# Patient Record
Sex: Female | Born: 1980 | Race: Black or African American | Hispanic: No | Smoking: Never smoker
Health system: Southern US, Community
[De-identification: ages and names within clinical notes are randomized; demographics above are authoritative.]

## PROBLEM LIST (undated history)

## (undated) ENCOUNTER — Inpatient Hospital Stay (HOSPITAL_COMMUNITY): Payer: Self-pay

## (undated) DIAGNOSIS — F32A Depression, unspecified: Secondary | ICD-10-CM

## (undated) DIAGNOSIS — A6000 Herpesviral infection of urogenital system, unspecified: Secondary | ICD-10-CM

## (undated) DIAGNOSIS — Z3687 Encounter for antenatal screening for uncertain dates: Secondary | ICD-10-CM

## (undated) DIAGNOSIS — B951 Streptococcus, group B, as the cause of diseases classified elsewhere: Secondary | ICD-10-CM

## (undated) DIAGNOSIS — Z6791 Unspecified blood type, Rh negative: Secondary | ICD-10-CM

## (undated) DIAGNOSIS — F329 Major depressive disorder, single episode, unspecified: Secondary | ICD-10-CM

## (undated) DIAGNOSIS — B999 Unspecified infectious disease: Secondary | ICD-10-CM

## (undated) DIAGNOSIS — O98819 Other maternal infectious and parasitic diseases complicating pregnancy, unspecified trimester: Secondary | ICD-10-CM

## (undated) DIAGNOSIS — Z6721 Type B blood, Rh negative: Secondary | ICD-10-CM

## (undated) DIAGNOSIS — J45909 Unspecified asthma, uncomplicated: Secondary | ICD-10-CM

## (undated) DIAGNOSIS — O99820 Streptococcus B carrier state complicating pregnancy: Secondary | ICD-10-CM

## (undated) DIAGNOSIS — B009 Herpesviral infection, unspecified: Secondary | ICD-10-CM

## (undated) DIAGNOSIS — A599 Trichomoniasis, unspecified: Secondary | ICD-10-CM

## (undated) DIAGNOSIS — O98211 Gonorrhea complicating pregnancy, first trimester: Secondary | ICD-10-CM

## (undated) HISTORY — DX: Type B blood, Rh negative: Z67.21

## (undated) HISTORY — DX: Other maternal infectious and parasitic diseases complicating pregnancy, unspecified trimester: O98.819

## (undated) HISTORY — DX: Gonorrhea complicating pregnancy, first trimester: O98.211

## (undated) HISTORY — DX: Unspecified infectious disease: B99.9

## (undated) HISTORY — DX: Herpesviral infection, unspecified: B00.9

## (undated) HISTORY — DX: Streptococcus, group b, as the cause of diseases classified elsewhere: B95.1

## (undated) HISTORY — DX: Encounter for antenatal screening for uncertain dates: Z36.87

## (undated) HISTORY — PX: THERAPEUTIC ABORTION: SHX798

## (undated) HISTORY — DX: Unspecified blood type, rh negative: Z67.91

---
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
## 2001-09-12 ENCOUNTER — Emergency Department (HOSPITAL_COMMUNITY): Admission: EM | Admit: 2001-09-12 | Discharge: 2001-09-12 | Payer: Self-pay | Admitting: Emergency Medicine

## 2003-11-05 ENCOUNTER — Inpatient Hospital Stay (HOSPITAL_COMMUNITY): Admission: AD | Admit: 2003-11-05 | Discharge: 2003-11-05 | Payer: Self-pay | Admitting: *Deleted

## 2003-11-19 ENCOUNTER — Ambulatory Visit (HOSPITAL_COMMUNITY): Admission: AD | Admit: 2003-11-19 | Discharge: 2003-11-19 | Payer: Self-pay | Admitting: *Deleted

## 2004-03-01 DIAGNOSIS — B951 Streptococcus, group B, as the cause of diseases classified elsewhere: Secondary | ICD-10-CM

## 2004-03-01 HISTORY — DX: Streptococcus, group b, as the cause of diseases classified elsewhere: B95.1

## 2004-04-23 ENCOUNTER — Inpatient Hospital Stay (HOSPITAL_COMMUNITY): Admission: AD | Admit: 2004-04-23 | Discharge: 2004-04-23 | Payer: Self-pay | Admitting: Obstetrics and Gynecology

## 2004-06-21 ENCOUNTER — Inpatient Hospital Stay (HOSPITAL_COMMUNITY): Admission: AD | Admit: 2004-06-21 | Discharge: 2004-06-21 | Payer: Self-pay | Admitting: Obstetrics and Gynecology

## 2004-06-22 ENCOUNTER — Inpatient Hospital Stay (HOSPITAL_COMMUNITY): Admission: AD | Admit: 2004-06-22 | Discharge: 2004-06-24 | Payer: Self-pay | Admitting: Obstetrics and Gynecology

## 2004-07-20 ENCOUNTER — Other Ambulatory Visit: Admission: RE | Admit: 2004-07-20 | Discharge: 2004-07-20 | Payer: Self-pay | Admitting: Obstetrics and Gynecology

## 2005-10-25 ENCOUNTER — Other Ambulatory Visit: Admission: RE | Admit: 2005-10-25 | Discharge: 2005-10-25 | Payer: Self-pay | Admitting: Obstetrics and Gynecology

## 2006-05-30 ENCOUNTER — Inpatient Hospital Stay (HOSPITAL_COMMUNITY): Admission: AD | Admit: 2006-05-30 | Discharge: 2006-06-01 | Payer: Self-pay | Admitting: Obstetrics and Gynecology

## 2011-01-01 ENCOUNTER — Emergency Department (HOSPITAL_COMMUNITY)
Admission: EM | Admit: 2011-01-01 | Discharge: 2011-01-02 | Disposition: A | Discharge status: Home / Self Care | Payer: BC Managed Care – PPO | Attending: Emergency Medicine | Admitting: Emergency Medicine

## 2011-01-01 ENCOUNTER — Emergency Department (HOSPITAL_COMMUNITY): Payer: BC Managed Care – PPO

## 2011-01-01 DIAGNOSIS — S90129A Contusion of unspecified lesser toe(s) without damage to nail, initial encounter: Secondary | ICD-10-CM | POA: Insufficient documentation

## 2011-01-01 DIAGNOSIS — M79609 Pain in unspecified limb: Secondary | ICD-10-CM | POA: Insufficient documentation

## 2011-01-01 DIAGNOSIS — Y92009 Unspecified place in unspecified non-institutional (private) residence as the place of occurrence of the external cause: Secondary | ICD-10-CM | POA: Insufficient documentation

## 2011-01-01 DIAGNOSIS — W208XXA Other cause of strike by thrown, projected or falling object, initial encounter: Secondary | ICD-10-CM | POA: Insufficient documentation

## 2011-04-30 HISTORY — PX: WISDOM TOOTH EXTRACTION: SHX21

## 2011-06-07 ENCOUNTER — Ambulatory Visit: Payer: Self-pay | Admitting: Obstetrics and Gynecology

## 2011-06-30 ENCOUNTER — Encounter: Payer: Self-pay | Admitting: Obstetrics and Gynecology

## 2011-06-30 ENCOUNTER — Ambulatory Visit (INDEPENDENT_AMBULATORY_CARE_PROVIDER_SITE_OTHER): Payer: BC Managed Care – PPO | Admitting: Obstetrics and Gynecology

## 2011-06-30 VITALS — BP 108/74 | HR 66 | Ht 62.0 in | Wt 157.0 lb

## 2011-06-30 DIAGNOSIS — N898 Other specified noninflammatory disorders of vagina: Secondary | ICD-10-CM

## 2011-06-30 DIAGNOSIS — Z01419 Encounter for gynecological examination (general) (routine) without abnormal findings: Secondary | ICD-10-CM

## 2011-06-30 LAB — POCT WET PREP (WET MOUNT)
Clue Cells Wet Prep Whiff POC: NEGATIVE
pH: 4.5

## 2011-06-30 MED ORDER — FLUCONAZOLE 150 MG PO TABS: 150.0000 mg | ORAL_TABLET | Freq: Once | ORAL | Status: AC

## 2011-06-30 NOTE — Progress Notes (Signed)
Last Pap: 03/2010 WNL: Yes Regular Periods:no  Monthly Breast exam:no Tetanus<74yrs:yes Nl.Bladder Function:yes Daily BMs:yes Healthy Diet:yes Calcium:no Mammogram:no Exercise:yes Seatbelt: yes Abuse at home: no Stressful work:no Sigmoid-colonoscopy: n/a  Bone Density: No Tami [name], Charetha  C/o ?yeast sxs, reports vag discharge, no obvious itching  Filed Vitals:   06/30/11 1506  BP: 108/74  Pulse: 66   ROS: noncontributory  Physical Examination: General appearance - alert, well appearing, and in no distress Neck - supple, no significant adenopathy Chest - clear to auscultation, no wheezes, rales or rhonchi, symmetric air entry Heart - normal rate and regular rhythm Abdomen - soft, nontender, nondistended, no masses or organomegaly Breasts - breasts appear normal, no suspicious masses, no skin or nipple changes or axillary nodes Pelvic - normal external genitalia, vulva, vagina, cervix, uterus and adnexa Back exam - no CVAT Extremities - no edema, redness or tenderness in the calves or thighs  Results for orders placed in visit on 06/30/11  POCT WET PREP (WET MOUNT)      Component Value Range   Source Wet Prep POC vaginal     WBC, Wet Prep HPF POC       Bacteria Wet Prep HPF POC none     BACTERIA WET PREP MORPHOLOGY POC       Clue Cells Wet Prep HPF POC None     CLUE CELLS WET PREP WHIFF POC Negative Whiff     Yeast Wet Prep HPF POC None     KOH Wet Prep POC       Trichomonas Wet Prep HPF POC none     pH 4.5     A/P Nl AEX Pap next yr Diflucan to pharmacy if sxs recur

## 2011-12-01 ENCOUNTER — Ambulatory Visit (INDEPENDENT_AMBULATORY_CARE_PROVIDER_SITE_OTHER): Payer: BC Managed Care – PPO | Admitting: Obstetrics and Gynecology

## 2011-12-01 DIAGNOSIS — Z331 Pregnant state, incidental: Secondary | ICD-10-CM

## 2011-12-01 DIAGNOSIS — B009 Herpesviral infection, unspecified: Secondary | ICD-10-CM

## 2011-12-01 HISTORY — DX: Herpesviral infection, unspecified: B00.9

## 2011-12-02 ENCOUNTER — Other Ambulatory Visit: Payer: Self-pay | Admitting: Obstetrics and Gynecology

## 2011-12-02 DIAGNOSIS — O26849 Uterine size-date discrepancy, unspecified trimester: Secondary | ICD-10-CM

## 2011-12-02 LAB — POCT URINALYSIS DIPSTICK
Blood, UA: NEGATIVE
Glucose, UA: NEGATIVE
Spec Grav, UA: 1.02
Urobilinogen, UA: NEGATIVE

## 2011-12-02 LAB — PRENATAL PANEL VII
Basophils Absolute: 0 10*3/uL (ref 0.0–0.1)
Basophils Relative: 0 % (ref 0–1)
Eosinophils Absolute: 0.3 10*3/uL (ref 0.0–0.7)
HIV: NONREACTIVE
Hepatitis B Surface Ag: NEGATIVE
MCH: 30.5 pg (ref 26.0–34.0)
MCHC: 34.5 g/dL (ref 30.0–36.0)
Neutro Abs: 7.3 10*3/uL (ref 1.7–7.7)
Neutrophils Relative %: 67 % (ref 43–77)
RDW: 13.7 % (ref 11.5–15.5)
Rh Type: NEGATIVE

## 2011-12-02 NOTE — Progress Notes (Signed)
NOB interview completed.  Pt has unknown lmp, scheduled pt for dating [place] on 12/07/11 @ 1330, NOB work up then scheduled @ 1400 w/ [facility].

## 2011-12-03 LAB — HEMOGLOBINOPATHY EVALUATION
Hemoglobin Other: 0 %
Hgb A2 Quant: 3.1 % (ref 2.2–3.2)
Hgb A: 96.6 % — ABNORMAL LOW (ref 96.8–97.8)
Hgb F Quant: 0.3 % (ref 0.0–2.0)

## 2011-12-04 LAB — OB RESULTS CONSOLE GBS: GBS: NEGATIVE

## 2011-12-04 LAB — CULTURE, OB URINE: Colony Count: 3000

## 2011-12-07 ENCOUNTER — Ambulatory Visit (INDEPENDENT_AMBULATORY_CARE_PROVIDER_SITE_OTHER): Payer: BC Managed Care – PPO

## 2011-12-07 VITALS — BP 100/62 | Wt 167.0 lb

## 2011-12-07 DIAGNOSIS — Z3687 Encounter for antenatal screening for uncertain dates: Secondary | ICD-10-CM

## 2011-12-07 DIAGNOSIS — B009 Herpesviral infection, unspecified: Secondary | ICD-10-CM

## 2011-12-07 DIAGNOSIS — Z789 Other specified health status: Secondary | ICD-10-CM

## 2011-12-07 DIAGNOSIS — Z331 Pregnant state, incidental: Secondary | ICD-10-CM

## 2011-12-07 DIAGNOSIS — O26849 Uterine size-date discrepancy, unspecified trimester: Secondary | ICD-10-CM

## 2011-12-07 DIAGNOSIS — Z3490 Encounter for supervision of normal pregnancy, unspecified, unspecified trimester: Secondary | ICD-10-CM

## 2011-12-07 DIAGNOSIS — Z6721 Type B blood, Rh negative: Secondary | ICD-10-CM

## 2011-12-07 HISTORY — DX: Encounter for antenatal screening for uncertain dates: Z36.87

## 2011-12-07 HISTORY — DX: Type B blood, Rh negative: Z67.21

## 2011-12-07 LAB — US OB COMP LESS 14 WKS

## 2011-12-07 NOTE — Progress Notes (Signed)
Pt is here for NOB work-up. Pt stated no issues today.

## 2011-12-07 NOTE — Progress Notes (Signed)
  Subjective:    Tami [name] is being seen today for her first obstetrical visit.  She is 7weeks based on an u/s today ([redacted]w[redacted]d based on LMP). 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 Relationship with [name]: spouse, living together. Patient does intend to breast feed. Pregnancy history fully reviewed.  Patient reports no complaints.  Review of Systems:   Review of Systems--negative for all systems  Objective:     BP 100/62  Wt 167 lb (75.751 kg)  BMI 30.54 kg/m2  LMP 06/13/2011 Physical Exam  Exam Physical Examination:  General appearance - alert, well appearing, and in no distress, oriented to person, place, and time and overweight Mental status - normal mood, behavior, speech, dress, motor activity, and thought processes Eyes - pupils equal and reactive, extraocular eye movements intact Neck - supple, no significant adenopathy, thyroid exam: thyroid is normal in size without nodules or tenderness Lymphatics - no palpable lymphadenopathy, no hepatosplenomegaly Chest - clear to auscultation, no wheezes, rales or rhonchi, symmetric air entry Heart - normal rate and regular rhythm Abdomen - soft, nontender, nondistended, no masses or organomegaly Breasts - breasts appear normal, no suspicious masses, no skin or nipple changes or axillary nodes Pelvic - normal external genitalia, vulva, vagina, cervix, uterus and adnexa, uterus enlarged 6-8 week size Extremities - peripheral pulses normal, no pedal edema, no clubbing or cyanosis Skin - normal coloration and turgor, no rashes, no suspicious skin lesions noted  U/s: SIUP w/ S<D w/ AUA=7weeks giving best EDC of 07/25/12; FHR=140 bpm; bilateral ov & adnexa WNL.   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 Prenatal vitamins. Problem list reviewed and updated. AFP3 discussed:  declined. Role of ultrasound in pregnancy discussed; fetal survey: ordered--anatomy at 19-20 weeks. Amniocentesis discussed: not indicated. Follow up in 4 weeks, or prn Practice routines rev'd.     Rexene Edison, CNM 12/07/11

## 2011-12-09 ENCOUNTER — Other Ambulatory Visit (INDEPENDENT_AMBULATORY_CARE_PROVIDER_SITE_OTHER): Payer: BC Managed Care – PPO

## 2011-12-09 DIAGNOSIS — O98211 Gonorrhea complicating pregnancy, first trimester: Secondary | ICD-10-CM

## 2011-12-09 DIAGNOSIS — IMO0002 Reserved for concepts with insufficient information to code with codable children: Secondary | ICD-10-CM

## 2011-12-09 DIAGNOSIS — O98219 Gonorrhea complicating pregnancy, unspecified trimester: Secondary | ICD-10-CM | POA: Insufficient documentation

## 2011-12-09 HISTORY — DX: Gonorrhea complicating pregnancy, first trimester: O98.211

## 2011-12-09 LAB — PAP IG, CT-NG, RFX HPV ASCU
Chlamydia Probe Amp: NEGATIVE
GC Probe Amp: POSITIVE — AB

## 2011-12-09 MED ORDER — CEFTRIAXONE SODIUM 1 G IJ SOLR
250.0000 mg | Freq: Once | INTRAMUSCULAR | Status: AC
  Administered 2011-12-09: 250 mg via INTRAMUSCULAR

## 2011-12-10 ENCOUNTER — Other Ambulatory Visit: Payer: BC Managed Care – PPO

## 2011-12-15 ENCOUNTER — Telehealth: Payer: Self-pay | Admitting: Obstetrics and Gynecology

## 2011-12-15 NOTE — Telephone Encounter (Signed)
Spoke with pt rgd msg pt states had positive gc states husband went and had testing he was neg wants to know why hers was positive his negative offered pt an appt to discuss with provider pt declined will discuss at appt 11/7

## 2011-12-21 ENCOUNTER — Telehealth: Payer: Self-pay | Admitting: Obstetrics and Gynecology

## 2011-12-22 ENCOUNTER — Encounter: Payer: BC Managed Care – PPO | Admitting: Obstetrics and Gynecology

## 2011-12-27 ENCOUNTER — Encounter: Payer: Self-pay | Admitting: Obstetrics and Gynecology

## 2011-12-27 ENCOUNTER — Ambulatory Visit (INDEPENDENT_AMBULATORY_CARE_PROVIDER_SITE_OTHER): Payer: BC Managed Care – PPO | Admitting: Obstetrics and Gynecology

## 2011-12-27 VITALS — BP 128/72 | Wt 167.0 lb

## 2011-12-27 DIAGNOSIS — N898 Other specified noninflammatory disorders of vagina: Secondary | ICD-10-CM

## 2011-12-27 DIAGNOSIS — N760 Acute vaginitis: Secondary | ICD-10-CM

## 2011-12-27 DIAGNOSIS — Z8619 Personal history of other infectious and parasitic diseases: Secondary | ICD-10-CM

## 2011-12-27 LAB — POCT WET PREP (WET MOUNT)

## 2011-12-27 MED ORDER — TERCONAZOLE 0.4 % VA CREA: 1.0000 | TOPICAL_CREAM | Freq: Every day | VAGINAL | Status: DC

## 2011-12-27 NOTE — Progress Notes (Signed)
[redacted]w[redacted]d Pt also c/o yeast infection. Has a white discharge with vaginal itching.  Wet prep shows yeast.  Terazol 7 ordered +GC 12/07/2011 pt wants to discuss. The patient has been married for 4 years with her husband as her only sexual partner.  He had negative gonorrhea testing.  She still certain that she could not had gonorrhea for 4 years and not had them be infected and she had no symptoms.she is concerned about a POSITIVE test but did not want to take the chance of not being treated.  She states her husband has made an appointment with a divorce attorney. She wants to have her test of cure culture done today.  She understands at her treatment was slightly less than 3 weeks ago which is our usual interval for test of cure.

## 2012-01-06 ENCOUNTER — Ambulatory Visit (INDEPENDENT_AMBULATORY_CARE_PROVIDER_SITE_OTHER): Payer: BC Managed Care – PPO | Admitting: Obstetrics and Gynecology

## 2012-01-06 VITALS — BP 122/80 | Wt 168.0 lb

## 2012-01-06 DIAGNOSIS — Z139 Encounter for screening, unspecified: Secondary | ICD-10-CM

## 2012-01-06 NOTE — Progress Notes (Signed)
Very tearful today.  Relationship is very strained due to +GC result.  Patient reports TOC done at last visit on 10/28 by [facility]--understands it was early. I am unable to find that result--I see where it was ordered. Lerry Liner, CMA, will investigate. TOC today. Patient declines genetic testing.   Support offered for patient's issues and concerns.

## 2012-01-06 NOTE — Progress Notes (Signed)
[redacted]w[redacted]d Pt to have TOC today Pt very emotional

## 2012-01-07 LAB — GC/CHLAMYDIA PROBE AMP
CT Probe RNA: NEGATIVE
GC Probe RNA: NEGATIVE

## 2012-01-31 ENCOUNTER — Encounter: Payer: Self-pay | Admitting: Obstetrics and Gynecology

## 2012-01-31 ENCOUNTER — Ambulatory Visit (INDEPENDENT_AMBULATORY_CARE_PROVIDER_SITE_OTHER): Payer: BC Managed Care – PPO | Admitting: Obstetrics and Gynecology

## 2012-01-31 VITALS — BP 100/70 | Wt 175.0 lb

## 2012-01-31 DIAGNOSIS — Z3689 Encounter for other specified antenatal screening: Secondary | ICD-10-CM

## 2012-01-31 DIAGNOSIS — Z3687 Encounter for antenatal screening for uncertain dates: Secondary | ICD-10-CM

## 2012-01-31 DIAGNOSIS — Z1389 Encounter for screening for other disorder: Secondary | ICD-10-CM

## 2012-01-31 DIAGNOSIS — Z3480 Encounter for supervision of other normal pregnancy, unspecified trimester: Secondary | ICD-10-CM | POA: Insufficient documentation

## 2012-01-31 NOTE — Progress Notes (Signed)
[redacted]w[redacted]d 01/06/12 TOC GC/CT neg Pt will get flu vaccine at husband's job No concerns per pt  Reviewed all NOB labs. Declines genetic testing Anatomy u/s [facility]

## 2012-02-16 ENCOUNTER — Telehealth: Payer: Self-pay | Admitting: Obstetrics and Gynecology

## 2012-02-16 NOTE — Telephone Encounter (Signed)
Tc to pt regarding msg.  Pt states has been under a lot of stress @ home and notices when she gets stressed she has lower abdominal pain.  Pt does not describe herself as being doubled over in pain, denies any bldg or abnl d/c.  She states when she sits down to relax it gets better.  Pt denies any hx of depression or anxiety.  Pt also states she drinks about 3-4 8 oz glass of water/day.  Pt advised pain may be ligament pain, advised to push fluids more may take Tylenol or Ibuprofen 600mg  Q 6hours for pain, continue to monitor sxs.  When she feels herself getting stressed recommended she get in a room by her self, take breaths and relax and when she comes back into the office on 02/28/12 for next appt to discuss w/ [facility], who she's seeing.  Pt voices agreement, will call office with any concerns prior to appt.

## 2012-02-28 ENCOUNTER — Ambulatory Visit (INDEPENDENT_AMBULATORY_CARE_PROVIDER_SITE_OTHER): Payer: BC Managed Care – PPO | Admitting: Obstetrics and Gynecology

## 2012-02-28 ENCOUNTER — Ambulatory Visit (INDEPENDENT_AMBULATORY_CARE_PROVIDER_SITE_OTHER): Payer: BC Managed Care – PPO

## 2012-02-28 VITALS — BP 120/70 | Wt 177.0 lb

## 2012-02-28 DIAGNOSIS — Z3480 Encounter for supervision of other normal pregnancy, unspecified trimester: Secondary | ICD-10-CM

## 2012-02-28 DIAGNOSIS — O358XX0 Maternal care for other (suspected) fetal abnormality and damage, not applicable or unspecified: Secondary | ICD-10-CM

## 2012-02-28 DIAGNOSIS — O98211 Gonorrhea complicating pregnancy, first trimester: Secondary | ICD-10-CM

## 2012-02-28 DIAGNOSIS — Z3689 Encounter for other specified antenatal screening: Secondary | ICD-10-CM

## 2012-02-28 DIAGNOSIS — O98219 Gonorrhea complicating pregnancy, unspecified trimester: Secondary | ICD-10-CM

## 2012-02-28 NOTE — Progress Notes (Signed)
[redacted]w[redacted]d No complaints today. Ultrasound shows:  SIUP  S=D     [place] EDD: 07/22/2012            EFW: 10 oz           AFI: n/a           Cervical length: 3.19 cm           Placenta localization: posterior           Fetal presentation: vertex                   Anatomy survey is normal           Gender : female Comments: no previa, placenta edge to cx = 3.7 cm - normal Normal placental cord insertion Normal fluid: AP pocket = 5 cm Measurements concordant with 1st u/s and established GA/EDD No abnormality seen Suggest f/u in 2-3 weeks or next visit cx closed. Normal ovaries/adnexa  C/o stress from her husband.  Recommended counseling for stress reduction.  Pt declined offer to set up counseling.  Felt she could do it herself. Requests STD testing again at [facility].  TOC from 01/10/12 was neg. U/S @ [facility] to complete anatomy

## 2012-02-29 LAB — US OB COMP + 14 WK

## 2012-03-01 NOTE — L&D Delivery Note (Signed)
Delivery Note Pt's labored progressed rapidly after admission.  Pt had requested an epidural a little after 0300, but by the time CBC resulted, cx 9cm.  At 0347, AROM, copious amt of clear fluid.  At 3:49 AM a viable female "Tami [name]" was delivered via Vaginal, Spontaneous Delivery (Presentation: ; Occiput Anterior).  APGAR: 9, 9; weight TBA .   Placenta status: Intact, Spontaneous, Schultz.  Cord: 3 vessels with the following complications: .  Cord pH: not collected.  Anesthesia: None  Episiotomy: None Lacerations: None Suture Repair: n/a Est. Blood Loss (mL): 250  Mom to postpartum.  Baby to nursery-stable. Pt desires inpatient circ, and plans to BF.    Jewels Langone H 07/08/2012, 4:13 AM

## 2012-03-20 ENCOUNTER — Telehealth: Payer: Self-pay | Admitting: Obstetrics and Gynecology

## 2012-03-20 ENCOUNTER — Other Ambulatory Visit: Payer: Self-pay | Admitting: Obstetrics and Gynecology

## 2012-03-20 ENCOUNTER — Inpatient Hospital Stay (HOSPITAL_COMMUNITY)
Admission: AD | Admit: 2012-03-20 | Discharge: 2012-03-20 | Disposition: A | Discharge status: Home / Self Care | Payer: BC Managed Care – PPO | Source: Ambulatory Visit | Attending: Obstetrics and Gynecology | Admitting: Obstetrics and Gynecology

## 2012-03-20 DIAGNOSIS — Z3687 Encounter for antenatal screening for uncertain dates: Secondary | ICD-10-CM

## 2012-03-20 DIAGNOSIS — O209 Hemorrhage in early pregnancy, unspecified: Secondary | ICD-10-CM

## 2012-03-20 DIAGNOSIS — O99891 Other specified diseases and conditions complicating pregnancy: Secondary | ICD-10-CM | POA: Insufficient documentation

## 2012-03-20 DIAGNOSIS — R319 Hematuria, unspecified: Secondary | ICD-10-CM | POA: Insufficient documentation

## 2012-03-20 LAB — URINALYSIS, ROUTINE W REFLEX MICROSCOPIC
Leukocytes, UA: NEGATIVE
Nitrite: NEGATIVE
Specific Gravity, Urine: 1.025 (ref 1.005–1.030)
pH: 6 (ref 5.0–8.0)

## 2012-03-20 LAB — WET PREP, GENITAL
Clue Cells Wet Prep HPF POC: NONE SEEN
Trich, Wet Prep: NONE SEEN
Yeast Wet Prep HPF POC: NONE SEEN

## 2012-03-20 LAB — URINE MICROSCOPIC-ADD ON

## 2012-03-20 MED ORDER — NITROFURANTOIN MONOHYD MACRO 100 MG PO CAPS: 100.0000 mg | ORAL_CAPSULE | Freq: Two times a day (BID) | ORAL | Status: DC

## 2012-03-20 NOTE — Telephone Encounter (Signed)
Pt called, is 21w 6d, states this am about 0900 when she went to the BR, noticed some blood in the toilet.  Did not have any more bldg until this afternoon when she went to the BR again, had some burning with urination and had some pink spotting with wiping.  Pt denies any pain and reports some +fm today.  Pt states her blood type is B negative and denies any recent IC.  Per VL, pt to MAU for Rhogam work up and [place].  Spoke w/ Darel Hong, RN @ MAU regarding pt, SR notified via text msg.

## 2012-03-20 NOTE — MAU Note (Signed)
This morning when used toilet, saw blood in the toilet. At 2 p.m.  Had some burning with urination and saw pink on the tissue when wiped.

## 2012-03-20 NOTE — Progress Notes (Signed)
MD informed of lab results, see DC order.

## 2012-03-20 NOTE — MAU Provider Note (Addendum)
Subjective:    Tami [name] is a 32 y.o. female, G3P2002 at 21+6 weeks who presents with c/o bleeding. This am following an episode of vomiting, went to void and saw bright red blood in the toilet. Did not have to wear a pad all day. Went to void again at 2:00 pm and saw a pink tinge on toilet paper. No other episode. Reports 1 episode of burning with urination today but no frequency. No BM today. Denies abdominal or back pain. Reports FM as usual  The following portions of the patient's history were reviewed and updated as appropriate: allergies, current medications, past family history.  Review of Systems Pertinent items are noted in HPI. Gastrointestinal: Negative for abdominal pain, change in bowel habits or rectal bleeding   Objective:    BP 121/67  Pulse 77  Temp 98.1 F (36.7 C) (Oral)  Resp 18  Ht 5' 2.25" (1.581 m)  Wt 181 lb (82.101 kg)  BMI 32.84 kg/m2  LMP 06/13/2011    Weight:  Wt Readings from Last 1 Encounters:  03/20/12 181 lb (82.101 kg)          BMI: Body mass index is 32.84 kg/(m^2).  General Appearance: Alert, appropriate appearance for age. No acute distress HEENT: Grossly normal Neck / Thyroid: Supple, no masses, nodes or enlargement Lungs: clear to auscultation bilaterally Back: No CVA tenderness Cardiovascular: Regular rate and rhythm. S1, S2, no murmur Gastrointestinal: Soft, non-tender, no masses or organomegaly Uterus is appropriate for GA and NT Pelvic Exam: Vulva and vagina appear normal.  No blood in the vagina or cervix. Cervix is long and closed.     Assessment:    21+6 weeks with what appears like hematuria    Plan:    Awaiting U/A and wet prep GC / Chlamydia cultures obtained  Silverio Lay MD  U/A: large Hgb and many bacteria c/w UTI Wet prep essentially negative D/C home with Macrobid 100 mg BID for 7 days. Urine sent to culture. Follow-up as scheduled in office

## 2012-03-22 ENCOUNTER — Other Ambulatory Visit: Payer: BC Managed Care – PPO

## 2012-03-22 LAB — URINE CULTURE
Colony Count: NO GROWTH
Culture: NO GROWTH
Special Requests: NORMAL

## 2012-03-27 ENCOUNTER — Encounter: Payer: BC Managed Care – PPO | Admitting: Obstetrics and Gynecology

## 2012-03-27 ENCOUNTER — Other Ambulatory Visit: Payer: BC Managed Care – PPO

## 2012-03-27 ENCOUNTER — Ambulatory Visit: Payer: BC Managed Care – PPO

## 2012-03-27 ENCOUNTER — Encounter: Payer: Self-pay | Admitting: Obstetrics and Gynecology

## 2012-03-27 ENCOUNTER — Ambulatory Visit: Payer: BC Managed Care – PPO | Admitting: Obstetrics and Gynecology

## 2012-03-27 VITALS — BP 100/60 | Wt 181.0 lb

## 2012-03-27 DIAGNOSIS — O358XX0 Maternal care for other (suspected) fetal abnormality and damage, not applicable or unspecified: Secondary | ICD-10-CM

## 2012-03-27 DIAGNOSIS — Z889 Allergy status to unspecified drugs, medicaments and biological substances status: Secondary | ICD-10-CM

## 2012-03-27 DIAGNOSIS — Z331 Pregnant state, incidental: Secondary | ICD-10-CM

## 2012-03-27 DIAGNOSIS — Z3689 Encounter for other specified antenatal screening: Secondary | ICD-10-CM

## 2012-03-27 LAB — US OB FOLLOW UP

## 2012-03-27 NOTE — Progress Notes (Signed)
[redacted]w[redacted]d wgt gain 16# Pt requests repeat GC/CT, rv'd they were neg 1/20 from visit at hospital Pt tearful and states her husband has been unfaithful and she has now moved out, has support from other family members Offered counseling, pt declined stating she could not afford it at this time, will check on resources.  [place] rv'd, previous anatomy viewed now and WNL  RTO 4wks, needs 1hr gtt [place]

## 2012-03-27 NOTE — Progress Notes (Signed)
[redacted]w[redacted]d No complaints per pt  Ultrasound shows:  SIUP  S=D     [place] EDD: 5/27/143           AFI: normal fluid / AP pocket = 5.9 cm profile and nasal bone            EFW = 58th%tile           Cervical length: 3.25 cm           Placenta localization: posterior           Fetal presentation: vertex                   Anatomy survey is normal           Gender : female All cardiac seen today. Normal appearing. Profile and nasal bone seen today

## 2012-04-19 ENCOUNTER — Telehealth: Payer: Self-pay | Admitting: Obstetrics and Gynecology

## 2012-04-19 NOTE — Telephone Encounter (Signed)
Spoke with pt rgd msg pt states she has the answer to her questions already

## 2012-04-24 ENCOUNTER — Other Ambulatory Visit: Payer: BC Managed Care – PPO

## 2012-04-24 ENCOUNTER — Ambulatory Visit: Payer: BC Managed Care – PPO | Admitting: Obstetrics and Gynecology

## 2012-04-24 VITALS — BP 98/60 | Wt 187.0 lb

## 2012-04-24 DIAGNOSIS — Z331 Pregnant state, incidental: Secondary | ICD-10-CM

## 2012-04-24 LAB — CBC
HCT: 35.3 % — ABNORMAL LOW (ref 36.0–46.0)
Hemoglobin: 12.1 g/dL (ref 12.0–15.0)
MCV: 86.5 fL (ref 78.0–100.0)
RBC: 4.08 MIL/uL (ref 3.87–5.11)
RDW: 13.8 % (ref 11.5–15.5)
WBC: 11.1 10*3/uL — ABNORMAL HIGH (ref 4.0–10.5)

## 2012-04-24 NOTE — Progress Notes (Signed)
Pt stated no issues today. GTT today given @2 :43      Draw @ 3:43

## 2012-04-24 NOTE — Progress Notes (Signed)
101w6d Glucola, hemoglobin, RPR sent. Return to office in 3 weeks. Circumcision, car seats, preterm labor, breast-feeding, diet, exercise, and contraception discussed.  Patient wants Mirena IUD. Dr. Stefano Gaul

## 2012-04-25 LAB — RPR

## 2012-05-06 IMAGING — CR DG TOE GREAT 2+V*R*
3 series · 3 of 3 positions shown · non-contrast
Comparison: None.

CLINICAL DATA: Right great toe pain following an injury today.

RIGHT TOE - 2+ VIEW

[t toes ap right]
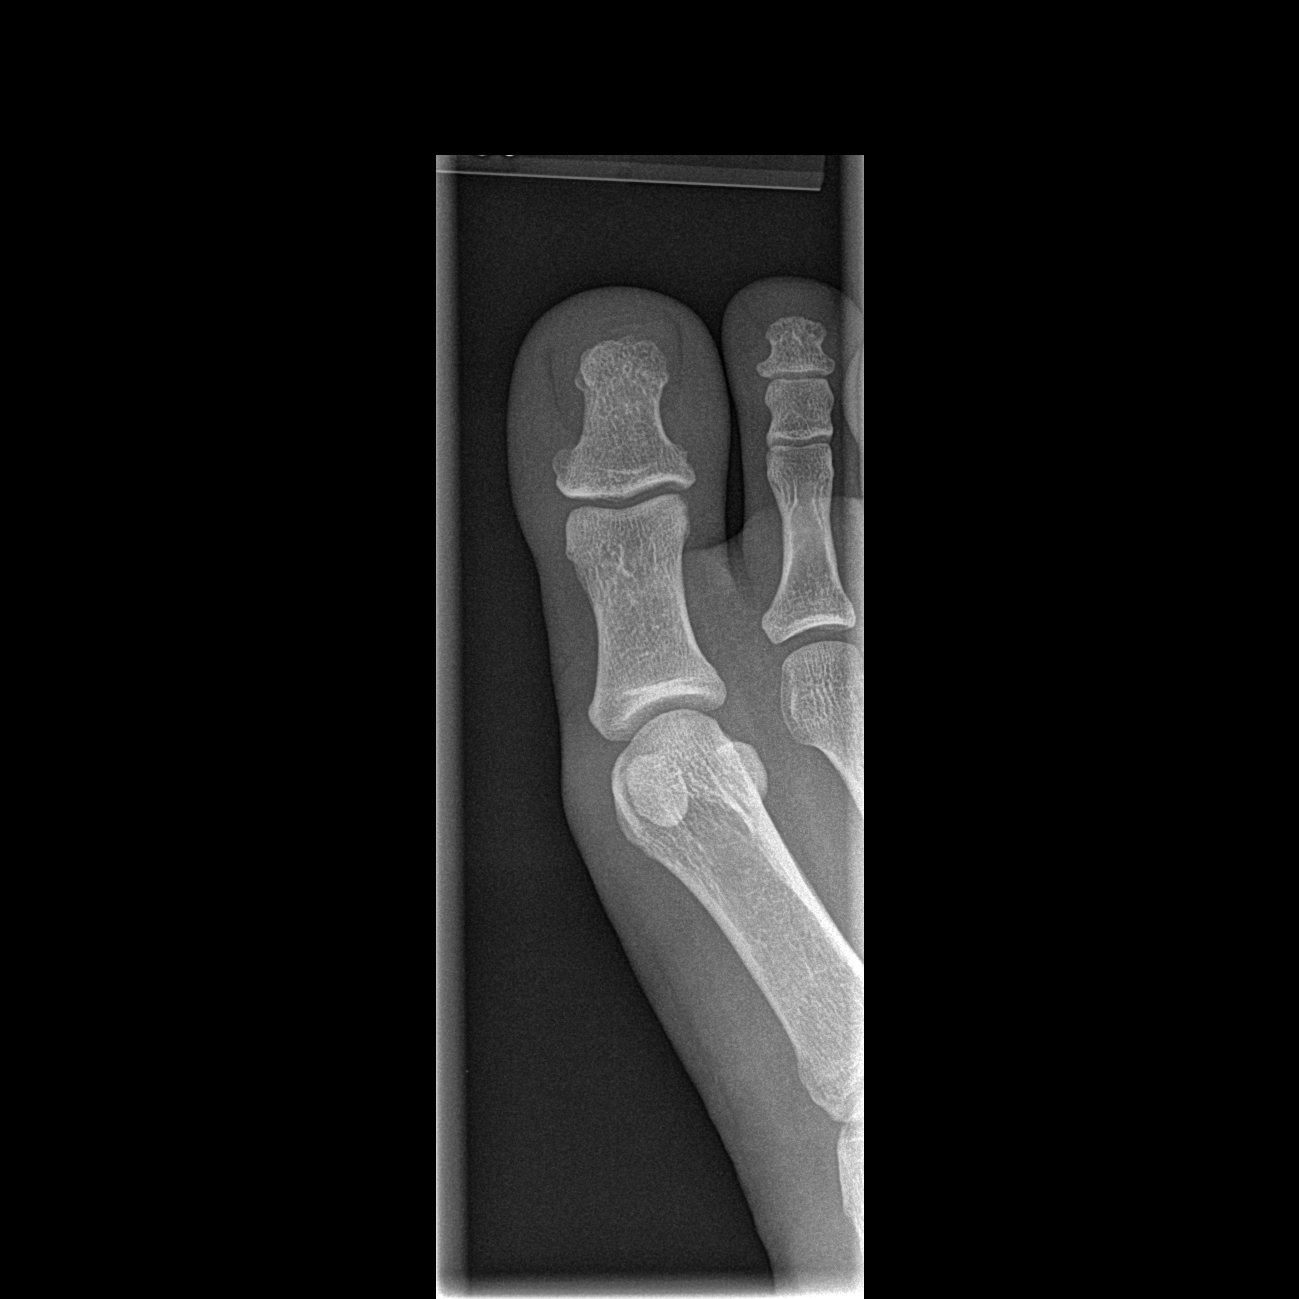

[t toes oblique right]
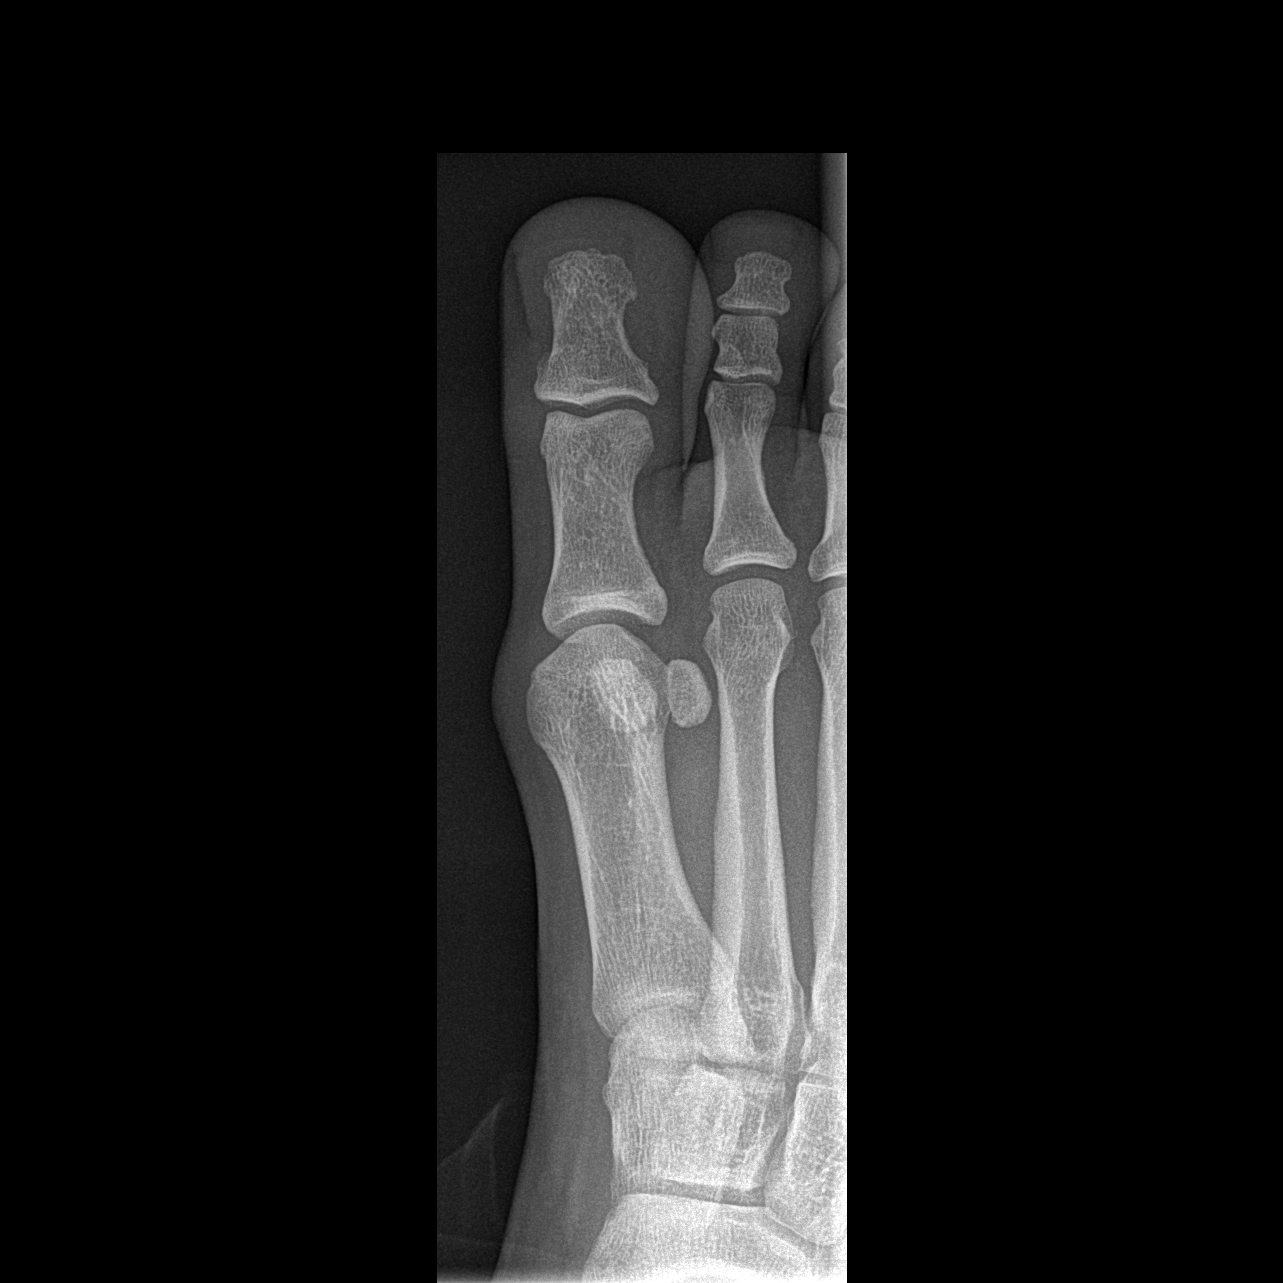

[t toes lateral right]
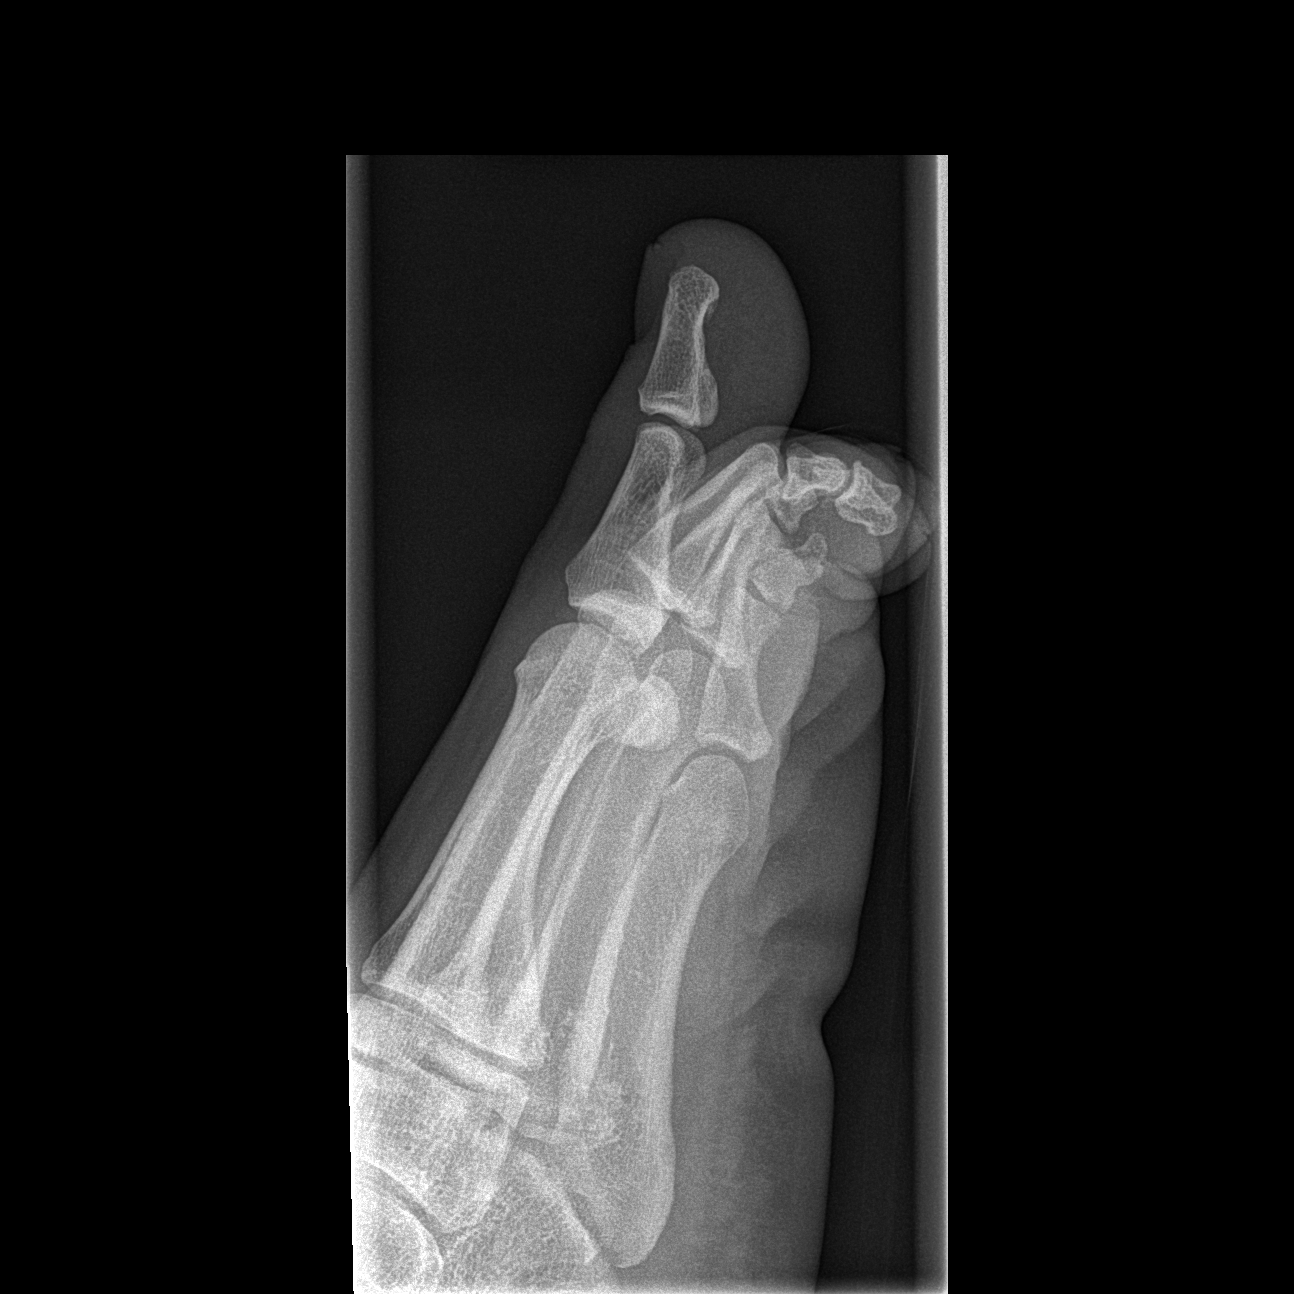

[3 of 3 positions shown; findings below may reference images not displayed]

FINDINGS: Normal appearing bones and soft tissues without fracture
or dislocation.
IMPRESSION: Normal examination.

## 2012-05-15 ENCOUNTER — Encounter: Payer: BC Managed Care – PPO | Admitting: Obstetrics and Gynecology

## 2012-05-25 ENCOUNTER — Other Ambulatory Visit: Payer: Self-pay | Admitting: Obstetrics and Gynecology

## 2012-05-27 ENCOUNTER — Inpatient Hospital Stay (HOSPITAL_COMMUNITY)
Admission: AD | Admit: 2012-05-27 | Discharge: 2012-05-27 | Disposition: A | Discharge status: Home / Self Care | Payer: BC Managed Care – PPO | Source: Ambulatory Visit | Attending: Obstetrics and Gynecology | Admitting: Obstetrics and Gynecology

## 2012-05-27 DIAGNOSIS — Z2989 Encounter for other specified prophylactic measures: Secondary | ICD-10-CM | POA: Insufficient documentation

## 2012-05-27 DIAGNOSIS — Z3480 Encounter for supervision of other normal pregnancy, unspecified trimester: Secondary | ICD-10-CM | POA: Insufficient documentation

## 2012-05-27 DIAGNOSIS — Z298 Encounter for other specified prophylactic measures: Secondary | ICD-10-CM | POA: Insufficient documentation

## 2012-05-27 MED ORDER — RHO D IMMUNE GLOBULIN 1500 UNIT/2ML IJ SOLN
300.0000 ug | Freq: Once | INTRAMUSCULAR | Status: AC
  Administered 2012-05-27: 300 ug via INTRAMUSCULAR
  Filled 2012-05-27: qty 2

## 2012-05-28 LAB — RH IG WORKUP (INCLUDES ABO/RH)
ABO/RH(D): B NEG
Antibody Screen: NEGATIVE
Gestational Age(Wks): 31

## 2012-06-30 DIAGNOSIS — O99820 Streptococcus B carrier state complicating pregnancy: Secondary | ICD-10-CM

## 2012-06-30 HISTORY — DX: Streptococcus B carrier state complicating pregnancy: O99.820

## 2012-06-30 LAB — OB RESULTS CONSOLE GBS: GBS: POSITIVE

## 2012-07-08 ENCOUNTER — Inpatient Hospital Stay (HOSPITAL_COMMUNITY)
Admission: AD | Admit: 2012-07-08 | Discharge: 2012-07-10 | DRG: 373 | Disposition: A | Discharge status: Home / Self Care | Payer: BC Managed Care – PPO | Source: Ambulatory Visit | Attending: Obstetrics and Gynecology | Admitting: Obstetrics and Gynecology

## 2012-07-08 ENCOUNTER — Encounter (HOSPITAL_COMMUNITY): Payer: Self-pay

## 2012-07-08 DIAGNOSIS — O99820 Streptococcus B carrier state complicating pregnancy: Secondary | ICD-10-CM

## 2012-07-08 DIAGNOSIS — O99892 Other specified diseases and conditions complicating childbirth: Principal | ICD-10-CM | POA: Diagnosis present

## 2012-07-08 DIAGNOSIS — Z22330 Carrier of Group B streptococcus: Secondary | ICD-10-CM

## 2012-07-08 DIAGNOSIS — Z3687 Encounter for antenatal screening for uncertain dates: Secondary | ICD-10-CM

## 2012-07-08 DIAGNOSIS — Z889 Allergy status to unspecified drugs, medicaments and biological substances status: Secondary | ICD-10-CM

## 2012-07-08 HISTORY — DX: Streptococcus B carrier state complicating pregnancy: O99.820

## 2012-07-08 LAB — CBC
HCT: 35.8 % — ABNORMAL LOW (ref 36.0–46.0)
Platelets: 170 10*3/uL (ref 150–400)
RDW: 13.7 % (ref 11.5–15.5)
WBC: 11.1 10*3/uL — ABNORMAL HIGH (ref 4.0–10.5)

## 2012-07-08 MED ORDER — ONDANSETRON HCL 4 MG/2ML IJ SOLN: 4.0000 mg | Freq: Four times a day (QID) | INTRAMUSCULAR | Status: DC | PRN

## 2012-07-08 MED ORDER — LACTATED RINGERS IV SOLN: INTRAVENOUS | Status: DC

## 2012-07-08 MED ORDER — LACTATED RINGERS IV SOLN: 500.0000 mL | INTRAVENOUS | Status: DC | PRN

## 2012-07-08 MED ORDER — ACETAMINOPHEN 325 MG PO TABS: 650.0000 mg | ORAL_TABLET | ORAL | Status: DC | PRN

## 2012-07-08 MED ORDER — PHENYLEPHRINE 40 MCG/ML (10ML) SYRINGE FOR IV PUSH (FOR BLOOD PRESSURE SUPPORT)
80.0000 ug | PREFILLED_SYRINGE | INTRAVENOUS | Status: DC | PRN
  Filled 2012-07-08: qty 2
  Filled 2012-07-08: qty 5

## 2012-07-08 MED ORDER — FLEET ENEMA 7-19 GM/118ML RE ENEM: 1.0000 | ENEMA | RECTAL | Status: DC | PRN

## 2012-07-08 MED ORDER — LIDOCAINE HCL (PF) 1 % IJ SOLN
30.0000 mL | INTRAMUSCULAR | Status: DC | PRN
  Filled 2012-07-08: qty 30

## 2012-07-08 MED ORDER — MEASLES, MUMPS & RUBELLA VAC ~~LOC~~ INJ
0.5000 mL | INJECTION | Freq: Once | SUBCUTANEOUS | Status: DC
  Filled 2012-07-08: qty 0.5

## 2012-07-08 MED ORDER — EPHEDRINE 5 MG/ML INJ
10.0000 mg | INTRAVENOUS | Status: DC | PRN
  Filled 2012-07-08: qty 2
  Filled 2012-07-08: qty 4

## 2012-07-08 MED ORDER — FENTANYL 2.5 MCG/ML BUPIVACAINE 1/10 % EPIDURAL INFUSION (WH - ANES)
14.0000 mL/h | INTRAMUSCULAR | Status: DC | PRN
  Filled 2012-07-08: qty 125

## 2012-07-08 MED ORDER — RHO D IMMUNE GLOBULIN 1500 UNIT/2ML IJ SOLN
300.0000 ug | Freq: Once | INTRAMUSCULAR | Status: AC
  Administered 2012-07-08: 300 ug via INTRAMUSCULAR
  Filled 2012-07-08: qty 2

## 2012-07-08 MED ORDER — CITRIC ACID-SODIUM CITRATE 334-500 MG/5ML PO SOLN: 30.0000 mL | ORAL | Status: DC | PRN

## 2012-07-08 MED ORDER — LIDOCAINE HCL (PF) 1 % IJ SOLN
30.0000 mL | INTRAMUSCULAR | Status: DC | PRN
  Filled 2012-07-08 (×3): qty 30

## 2012-07-08 MED ORDER — PRENATAL MULTIVITAMIN CH
1.0000 | ORAL_TABLET | Freq: Every day | ORAL | Status: DC
  Administered 2012-07-08 – 2012-07-10 (×3): 1 via ORAL
  Filled 2012-07-08 (×3): qty 1

## 2012-07-08 MED ORDER — DIBUCAINE 1 % RE OINT: 1.0000 "application " | TOPICAL_OINTMENT | RECTAL | Status: DC | PRN

## 2012-07-08 MED ORDER — IBUPROFEN 600 MG PO TABS
600.0000 mg | ORAL_TABLET | Freq: Four times a day (QID) | ORAL | Status: DC | PRN
  Administered 2012-07-08: 600 mg via ORAL
  Filled 2012-07-08: qty 1

## 2012-07-08 MED ORDER — ONDANSETRON HCL 4 MG/2ML IJ SOLN: 4.0000 mg | INTRAMUSCULAR | Status: DC | PRN

## 2012-07-08 MED ORDER — LACTATED RINGERS IV SOLN: 500.0000 mL | Freq: Once | INTRAVENOUS | Status: DC

## 2012-07-08 MED ORDER — POLYSACCHARIDE IRON COMPLEX 150 MG PO CAPS
150.0000 mg | ORAL_CAPSULE | Freq: Two times a day (BID) | ORAL | Status: DC
  Filled 2012-07-08: qty 1

## 2012-07-08 MED ORDER — SIMETHICONE 80 MG PO CHEW: 80.0000 mg | CHEWABLE_TABLET | ORAL | Status: DC | PRN

## 2012-07-08 MED ORDER — LANOLIN HYDROUS EX OINT: TOPICAL_OINTMENT | CUTANEOUS | Status: DC | PRN

## 2012-07-08 MED ORDER — DIPHENHYDRAMINE HCL 50 MG/ML IJ SOLN: 12.5000 mg | INTRAMUSCULAR | Status: DC | PRN

## 2012-07-08 MED ORDER — IBUPROFEN 600 MG PO TABS
600.0000 mg | ORAL_TABLET | Freq: Four times a day (QID) | ORAL | Status: DC | PRN
  Administered 2012-07-09: 600 mg via ORAL
  Filled 2012-07-08 (×7): qty 1

## 2012-07-08 MED ORDER — FENTANYL CITRATE 0.05 MG/ML IJ SOLN: 100.0000 ug | INTRAMUSCULAR | Status: DC | PRN

## 2012-07-08 MED ORDER — OXYTOCIN 40 UNITS IN LACTATED RINGERS INFUSION - SIMPLE MED
62.5000 mL/h | INTRAVENOUS | Status: DC
  Administered 2012-07-08: 999 mL/h via INTRAVENOUS
  Filled 2012-07-08: qty 1000

## 2012-07-08 MED ORDER — WITCH HAZEL-GLYCERIN EX PADS: 1.0000 "application " | MEDICATED_PAD | CUTANEOUS | Status: DC | PRN

## 2012-07-08 MED ORDER — BENZOCAINE-MENTHOL 20-0.5 % EX AERO
1.0000 "application " | INHALATION_SPRAY | CUTANEOUS | Status: DC | PRN
  Administered 2012-07-08: 1 via TOPICAL
  Filled 2012-07-08: qty 56

## 2012-07-08 MED ORDER — MAGNESIUM HYDROXIDE 400 MG/5ML PO SUSP: 30.0000 mL | ORAL | Status: DC | PRN

## 2012-07-08 MED ORDER — PHENYLEPHRINE 40 MCG/ML (10ML) SYRINGE FOR IV PUSH (FOR BLOOD PRESSURE SUPPORT)
80.0000 ug | PREFILLED_SYRINGE | INTRAVENOUS | Status: DC | PRN
  Filled 2012-07-08: qty 2

## 2012-07-08 MED ORDER — DIPHENHYDRAMINE HCL 25 MG PO CAPS: 25.0000 mg | ORAL_CAPSULE | Freq: Four times a day (QID) | ORAL | Status: DC | PRN

## 2012-07-08 MED ORDER — OXYCODONE-ACETAMINOPHEN 5-325 MG PO TABS: 1.0000 | ORAL_TABLET | ORAL | Status: DC | PRN

## 2012-07-08 MED ORDER — SODIUM CHLORIDE 0.9 % IJ SOLN: 3.0000 mL | Freq: Two times a day (BID) | INTRAMUSCULAR | Status: DC

## 2012-07-08 MED ORDER — EPHEDRINE 5 MG/ML INJ
10.0000 mg | INTRAVENOUS | Status: DC | PRN
  Filled 2012-07-08: qty 2

## 2012-07-08 MED ORDER — SODIUM CHLORIDE 0.9 % IV SOLN: 250.0000 mL | INTRAVENOUS | Status: DC | PRN

## 2012-07-08 MED ORDER — HYDROXYZINE HCL 50 MG PO TABS
50.0000 mg | ORAL_TABLET | Freq: Four times a day (QID) | ORAL | Status: DC | PRN
  Filled 2012-07-08: qty 1

## 2012-07-08 MED ORDER — TETANUS-DIPHTH-ACELL PERTUSSIS 5-2.5-18.5 LF-MCG/0.5 IM SUSP
0.5000 mL | Freq: Once | INTRAMUSCULAR | Status: AC
  Administered 2012-07-08: 0.5 mL via INTRAMUSCULAR

## 2012-07-08 MED ORDER — IBUPROFEN 600 MG PO TABS
600.0000 mg | ORAL_TABLET | Freq: Four times a day (QID) | ORAL | Status: DC
  Administered 2012-07-08 – 2012-07-10 (×7): 600 mg via ORAL
  Filled 2012-07-08 (×2): qty 1

## 2012-07-08 MED ORDER — OXYTOCIN BOLUS FROM INFUSION: 500.0000 mL | INTRAVENOUS | Status: DC

## 2012-07-08 MED ORDER — OXYCODONE-ACETAMINOPHEN 5-325 MG PO TABS
1.0000 | ORAL_TABLET | ORAL | Status: DC | PRN
  Administered 2012-07-08 (×2): 1 via ORAL
  Filled 2012-07-08 (×2): qty 1

## 2012-07-08 MED ORDER — ZOLPIDEM TARTRATE 5 MG PO TABS: 5.0000 mg | ORAL_TABLET | Freq: Every evening | ORAL | Status: DC | PRN

## 2012-07-08 MED ORDER — SODIUM CHLORIDE 0.9 % IJ SOLN: 3.0000 mL | INTRAMUSCULAR | Status: DC | PRN

## 2012-07-08 MED ORDER — METHYLERGONOVINE MALEATE 0.2 MG PO TABS: 0.2000 mg | ORAL_TABLET | ORAL | Status: DC | PRN

## 2012-07-08 MED ORDER — OXYTOCIN 40 UNITS IN LACTATED RINGERS INFUSION - SIMPLE MED: 62.5000 mL/h | INTRAVENOUS | Status: DC

## 2012-07-08 MED ORDER — LACTATED RINGERS IV SOLN
INTRAVENOUS | Status: DC
  Administered 2012-07-08: 03:00:00 via INTRAVENOUS

## 2012-07-08 MED ORDER — METHYLERGONOVINE MALEATE 0.2 MG/ML IJ SOLN: 0.2000 mg | INTRAMUSCULAR | Status: DC | PRN

## 2012-07-08 MED ORDER — ONDANSETRON HCL 4 MG PO TABS: 4.0000 mg | ORAL_TABLET | ORAL | Status: DC | PRN

## 2012-07-08 MED ORDER — VANCOMYCIN HCL IN DEXTROSE 1-5 GM/200ML-% IV SOLN
1000.0000 mg | Freq: Two times a day (BID) | INTRAVENOUS | Status: DC
  Filled 2012-07-08 (×2): qty 200

## 2012-07-08 MED ORDER — SENNOSIDES-DOCUSATE SODIUM 8.6-50 MG PO TABS
2.0000 | ORAL_TABLET | Freq: Every day | ORAL | Status: DC
  Administered 2012-07-08 – 2012-07-09 (×2): 2 via ORAL

## 2012-07-08 NOTE — Lactation Note (Signed)
This note was copied from the chart of Tami [name]. Lactation Consultation Note  Patient Name: Tami [name] [id number] Date: 07/08/2012 Reason for consult: Initial assessment   Maternal Data Formula Feeding for Exclusion: No Infant to breast within first hour of birth: Yes Does the patient have breastfeeding experience prior to this delivery?: Yes  Feeding Feeding Type: Breast Milk Feeding method: Breast Length of feed: 15 min  LATCH Score/Interventions Latch: Grasps breast easily, tongue down, lips flanged, rhythmical sucking. (tongue thrusting, chewing)  Audible Swallowing: A few with stimulation  Type of Nipple: Everted at rest and after stimulation  Comfort (Breast/Nipple): Filling, red/small blisters or bruises, mild/mod discomfort  Problem noted: Mild/Moderate discomfort Interventions (Mild/moderate discomfort): Comfort gels  Hold (Positioning): Assistance needed to correctly position infant at breast and maintain latch. Intervention(s): Breastfeeding basics reviewed;Support Pillows  LATCH Score: 7  Lactation Tools Discussed/Used     Consult Status Consult Status: Follow-up Date: 07/09/12 Follow-up type: In-patient  Initial visit with mom. Experienced BF mom reports that her nipples are sore- 3 on the pain scale. Nipples look intact. Baby alert and showing feeding cues. Assisted mom with latch- baby is doing some tongue thrusting and some chewing when he gets on the breast. Nipple slightly pinched when he came off breast. Mom reports that it feels better toward the end of the feeding. Mom has requested lanolin. RN gave it to her but encouraged her to use EBM instead. Mom has not used it yet. Comfort gels given with instructions for use and cleaning. Mom reports that they feel great. Mom also requested breast shells to keep her clothes off her nipples. Given with instructions. No questions at present. To call for assist prn. BF brochure given with  resources for support after DC.   Tami [name] 07/08/2012, 1:36 PM

## 2012-07-08 NOTE — MAU Note (Signed)
Patient states onset of contractions around 8:00 p.m. About 5 minutes apart, G3P2 [redacted]w[redacted]d, denies vaginal bleeding or LOF.

## 2012-07-08 NOTE — Progress Notes (Signed)
Pt off monitors to go to br.

## 2012-07-09 LAB — RH IG WORKUP (INCLUDES ABO/RH)
Fetal Screen: NEGATIVE
Unit division: 0

## 2012-07-09 LAB — CBC
Hemoglobin: 11.3 g/dL — ABNORMAL LOW (ref 12.0–15.0)
MCH: 29.2 pg (ref 26.0–34.0)
Platelets: 163 10*3/uL (ref 150–400)
RBC: 3.87 MIL/uL (ref 3.87–5.11)
WBC: 11.3 10*3/uL — ABNORMAL HIGH (ref 4.0–10.5)

## 2012-07-09 NOTE — Progress Notes (Signed)
Patient ID: Tami [name], female   DOB: 11-29-1980, 32 y.o.   MRN: [id number] Post Partum Day 1  Subjective: no complaints, up ad lib without syncope, voiding, tolerating PO,  Pain well controlled with po meds,  BF started  Mood stable, bonding well Contraception: mirena    Objective: Blood pressure 113/72, pulse 77, temperature 98.1 F (36.7 C), temperature source Oral, resp. rate 17, height 5\' 3"  (1.6 m), weight 199 lb (90.266 kg), last menstrual period 06/13/2011, SpO2 98.00%, unknown if currently breastfeeding.  Physical Exam:  General: NAD  Lochia: appropriate Uterine Fundus: firm Perineum: WNL DVT Evaluation: No evidence of DVT seen on physical exam. Negative Homan's sign. No significant calf/ankle edema.   Recent Labs  07/08/12 0235 07/09/12 0557  HGB 12.5 11.3*  HCT 35.8* 33.3*    Assessment/Plan: Plan for discharge tomorrow, Breastfeeding and Circumcision prior to discharge Circumcision mirena       LOS: 1 day   Media Pizzini M 07/09/2012, 9:55 AM

## 2012-07-10 MED ORDER — IBUPROFEN 600 MG PO TABS: 600.0000 mg | ORAL_TABLET | Freq: Four times a day (QID) | ORAL | Status: DC

## 2012-07-10 NOTE — Discharge Summary (Signed)
  Vaginal Delivery Discharge Summary  Tami [name]  DOB:    07-12-1980 MRN:    [id number] CSN:    [id number]  Date of admission:                  07/08/12  Date of discharge:                   07/10/12  Procedures this admission: SVD with no repair  Date of Delivery: 07/08/12 by French Ana  Newborn Data:  Live born female  Birth Weight: 7 lb 0.2 oz (3180 g) APGAR: 9, 9  Home with mother. Name: "Tami [name]" Circumcision Plan: Inpatient  History of Present Illness:  [name] is a 32 y.o. female, [phone], who presents at [redacted]w[redacted]d weeks gestation. The patient has been followed at the [facility] Gy[facility]cology division of [facility]. She was admitted onset of labor. Her pregnancy has been complicated by:  Patient Active Problem List   Diagnosis Date Noted  . NSVD (normal spontaneous vaginal delivery) 07/08/2012  . GBS (group B Streptococcus carrier), +RV culture, currently pregnant 06/30/2012  . Drug allergy - PCN 03/27/2012  . Normal pregnancy, repeat 01/31/2012  . Vaginitis 12/27/2011  . Gonorrhea complicating pregnancy in first trimester 12/09/2011  . Unsure of LMP (last menstrual period) as reason for ultrasound scan 12/07/2011   Hospital course:  The patient was admitted for labor.   Her labor was not complicated. She proceeded to have a vaginal delivery of a healthy infant. Her delivery was not complicated. Her postpartum course was not complicated.  She was discharged to home on postpartum day 2 doing well.  Feeding:  breast  Contraception:  IUD  Discharge hemoglobin:  Hemoglobin  Date Value Range Status  07/09/2012 11.3* 12.0 - 15.0 g/dL Final     HCT  Date Value Range Status  07/09/2012 33.3* 36.0 - 46.0 % Final    Discharge Physical Exam:   General: alert, cooperative and no distress Lochia: appropriate Uterine Fundus: firm Incision: n/a DVT Evaluation: No evidence of DVT seen on physical  exam. Negative Homan's sign.  Intrapartum Procedures: spontaneous vaginal delivery and GBS prophylaxis Postpartum Procedures: Rho(D) Ig Complications-Operative and Postpartum: none  Discharge Diagnoses: Term Pregnancy-delivered  Discharge Information:  Activity:           per [facility] handbook Diet:                routine Medications: PNV and Ibuprofen Condition:      stable Instructions:  refer to practice specific booklet Discharge to: home     Haroldine Laws 07/10/2012

## 2012-07-11 LAB — TYPE AND SCREEN: Unit division: 0

## 2012-08-17 NOTE — H&P (Signed)
Tami [name] is a 32 y.o.black female presenting for labor check at [redacted]w[redacted]d.   Reports onset of ctxs around 2000 last night.  No LOF or VB.  Denies UTI or PIH s/s.   Prenatal course: Onset of care around [redacted]w[redacted]d w/ unsure LMP and had dating u/s at 7weeks w/ NOB w/u.  Pap sent at that visit and pos GC cx.  Pt's pregnancy with extreme stress following positive cx, and strained marital relationship; pt eventually moved out of house in 2nd trimester secondary to husband disclosing infidelity.  Counseling rec'd immediately following cx, as husband had sought counsel of divorce attorney in 1st trimester, but pt declined throughout pregnancy, stating too costly. She had neg TOC following original cx, and again 03/20/12 when seen in MAU for suspected UTI w/ hematuria.   Pt declined aneuploidy screening.  Anatomy scan at [redacted]w[redacted]d incomplete, but cardiac and facial views not previously seen, were viewed at f/u [redacted]w[redacted]d.  Normal 1hr gtt.   OB Hx: G1=SVD '06 F=6+7 by Wynelle Bourgeois, CNM G2=SVD '08 F=7lb by Philipp Deputy, CNM G3=current  Maternal Medical History:  Reason for admission: Contractions.   Contractions: Onset was 3-5 hours ago.   Frequency: regular.   Perceived severity is strong.    Fetal activity: Perceived fetal activity is normal.      OB History as of 07/10/12   Grav Para Term Preterm Abortions TAB SAB Ect Mult Living   3 3 3       3      Past Medical History  Diagnosis Date  . Infection     Yeast inf;Not frequent lately  . Blood type, Rh negative B -  . Group B streptococcal infection in pregnancy 2006  . Unsure of LMP (last menstrual period) as reason for ultrasound scan 12/07/2011    EDC by 7week u/s  . Gonorrhea complicating pregnancy in first trimester 12/09/2011    Positive cx on pap done 12/07/11  . GBS (group B Streptococcus carrier), +RV culture, currently pregnant 06/30/2012    C&S showed Resistant to EES and Clindamycin; sensitive to Vanc  . NSVD (normal spontaneous vaginal  delivery) 07/08/2012   Past Surgical History  Procedure Laterality Date  . Wisdom tooth extraction  04/2011    All 4 removed   Family History: family history includes Diabetes in her maternal grandmother; Migraines in her maternal grandmother; and Thyroid disease in her mother. Social History:  reports that she has never smoked. She has never used smokeless tobacco. She reports that she does not drink alcohol or use illicit drugs.   Prenatal Transfer Tool  Maternal Diabetes: No Genetic Screening: Declined Maternal Ultrasounds/Referrals: Normal Fetal Ultrasounds or other Referrals:  None Maternal Substance Abuse:  No Significant Maternal Medications:  None Significant Maternal Lab Results:  Lab values include: Group B Strep positive, Rh negative Other Comments:  None  Review of Systems  Constitutional: Negative.   HENT: Negative.   Eyes: Negative.   Respiratory: Negative.   Cardiovascular: Negative.   Gastrointestinal: Negative.   Genitourinary: Negative.   Skin: Negative.   Neurological: Negative.   VS: 124/71, 97.8, 16, 83  Maternal Exam:  Uterine Assessment: Contraction frequency is regular.   Abdomen: Patient reports no abdominal tenderness. Fetal presentation: vertex  Introitus: Normal vulva. Normal vagina.  Pelvis: adequate for delivery.   Cervix: Cervix evaluated by sterile speculum exam and digital exam.     Physical Exam  Constitutional: She is oriented to person, place, and time. She appears well-developed and well-nourished.  She appears distressed.  HENT:  Head: Normocephalic and atraumatic.  Eyes: Pupils are equal, round, and reactive to light.  Cardiovascular: Normal rate.   Respiratory: Effort normal.  GI: Soft.  gravid  Genitourinary:  SSE-neg for lesions Cx=3.5/80/-1  Neurological: She is alert and oriented to person, place, and time. She has normal reflexes.  Skin: Skin is warm and dry.    Prenatal labs: ABO, Rh: --/--/B NEG (05/10  1534) Antibody: POS (05/10 0427) Rubella: 65.0 (10/02 1540) RPR: NON REACTIVE (05/10 0235)  HBsAg: NEGATIVE (10/02 1540)  HIV: NON REACTIVE (10/02 1540)  GBS: Positive (05/02 0000)  1hr gtt=71 Pap 12/07/11 negative for malignancy, but pos gc cx Gc/ct cx's negative 01/06/12, and 03/20/12  Assessment/Plan: 1. [redacted]w[redacted]d 2. Active labor 3. Cat I FHT 4. GBS pos w/ PCN allergy 5. Rh neg  1. Admit to Bs w/ Dr. Estanislado Pandy as attending 2. Routine L&D orders 3. Epidural ASAP 4. Vancomycin per GBS protocol secondary to sensitivities and allergy 5. AROM/Pitocin prn further augmentation 6. Anticipate SVD 7. C/w MD prn   Milam Allbaugh H 08/17/2012, 9:56 AM

## 2014-07-05 ENCOUNTER — Other Ambulatory Visit (HOSPITAL_COMMUNITY)
Admission: RE | Admit: 2014-07-05 | Discharge: 2014-07-05 | Disposition: A | Discharge status: Home / Self Care | Payer: Medicaid Other | Source: Ambulatory Visit | Attending: Family Medicine | Admitting: Family Medicine

## 2014-07-05 DIAGNOSIS — Z113 Encounter for screening for infections with a predominantly sexual mode of transmission: Secondary | ICD-10-CM | POA: Diagnosis present

## 2014-07-05 DIAGNOSIS — Z01419 Encounter for gynecological examination (general) (routine) without abnormal findings: Secondary | ICD-10-CM | POA: Insufficient documentation

## 2014-07-05 DIAGNOSIS — Z1151 Encounter for screening for human papillomavirus (HPV): Secondary | ICD-10-CM | POA: Insufficient documentation

## 2014-07-05 DIAGNOSIS — N760 Acute vaginitis: Secondary | ICD-10-CM | POA: Insufficient documentation

## 2014-12-17 ENCOUNTER — Other Ambulatory Visit (HOSPITAL_COMMUNITY)
Admission: RE | Admit: 2014-12-17 | Discharge: 2014-12-17 | Disposition: A | Discharge status: Home / Self Care | Payer: Medicaid Other | Source: Ambulatory Visit | Attending: Family Medicine | Admitting: Family Medicine

## 2014-12-17 DIAGNOSIS — Z113 Encounter for screening for infections with a predominantly sexual mode of transmission: Secondary | ICD-10-CM | POA: Insufficient documentation

## 2014-12-17 DIAGNOSIS — N760 Acute vaginitis: Secondary | ICD-10-CM | POA: Diagnosis present

## 2014-12-26 ENCOUNTER — Other Ambulatory Visit: Payer: Medicaid Other | Admitting: Family Medicine

## 2014-12-26 ENCOUNTER — Other Ambulatory Visit (HOSPITAL_COMMUNITY)
Admission: RE | Admit: 2014-12-26 | Discharge: 2014-12-26 | Disposition: A | Discharge status: Home / Self Care | Payer: Medicaid Other | Source: Ambulatory Visit | Attending: Family Medicine | Admitting: Family Medicine

## 2014-12-26 DIAGNOSIS — Z113 Encounter for screening for infections with a predominantly sexual mode of transmission: Secondary | ICD-10-CM | POA: Insufficient documentation

## 2014-12-26 DIAGNOSIS — N760 Acute vaginitis: Secondary | ICD-10-CM | POA: Insufficient documentation

## 2015-01-01 LAB — URINE CYTOLOGY ANCILLARY ONLY
Bacterial vaginitis: NEGATIVE
CHLAMYDIA, DNA PROBE: NEGATIVE
Candida vaginitis: NEGATIVE
Neisseria Gonorrhea: NEGATIVE
Trichomonas: NEGATIVE

## 2015-04-21 ENCOUNTER — Other Ambulatory Visit: Payer: Self-pay | Admitting: Family Medicine

## 2015-04-21 ENCOUNTER — Other Ambulatory Visit (HOSPITAL_COMMUNITY)
Admission: RE | Admit: 2015-04-21 | Discharge: 2015-04-21 | Disposition: A | Discharge status: Home / Self Care | Payer: Medicaid Other | Source: Ambulatory Visit | Attending: Family Medicine | Admitting: Family Medicine

## 2015-04-21 DIAGNOSIS — N760 Acute vaginitis: Secondary | ICD-10-CM | POA: Insufficient documentation

## 2015-04-21 DIAGNOSIS — Z113 Encounter for screening for infections with a predominantly sexual mode of transmission: Secondary | ICD-10-CM | POA: Diagnosis not present

## 2015-04-24 LAB — URINE CYTOLOGY ANCILLARY ONLY
BACTERIAL VAGINITIS: POSITIVE — AB
Candida vaginitis: NEGATIVE
Chlamydia: NEGATIVE
Neisseria Gonorrhea: NEGATIVE
TRICH (WINDOWPATH): NEGATIVE

## 2015-04-27 ENCOUNTER — Emergency Department (HOSPITAL_COMMUNITY)
Admission: EM | Admit: 2015-04-27 | Discharge: 2015-04-27 | Disposition: A | Discharge status: Home / Self Care | Payer: Medicaid Other | Source: Home / Self Care | Attending: Emergency Medicine | Admitting: Emergency Medicine

## 2015-04-27 ENCOUNTER — Encounter (HOSPITAL_COMMUNITY): Payer: Self-pay | Admitting: Emergency Medicine

## 2015-04-27 DIAGNOSIS — J111 Influenza due to unidentified influenza virus with other respiratory manifestations: Secondary | ICD-10-CM | POA: Diagnosis not present

## 2015-04-27 MED ORDER — HYDROCODONE-HOMATROPINE 5-1.5 MG/5ML PO SYRP: 5.0000 mL | ORAL_SOLUTION | Freq: Four times a day (QID) | ORAL | Status: DC | PRN

## 2015-04-27 MED ORDER — OSELTAMIVIR PHOSPHATE 75 MG PO CAPS: 75.0000 mg | ORAL_CAPSULE | Freq: Two times a day (BID) | ORAL | Status: DC

## 2015-04-27 MED ORDER — ONDANSETRON HCL 4 MG PO TABS: 4.0000 mg | ORAL_TABLET | Freq: Three times a day (TID) | ORAL | Status: DC | PRN

## 2015-04-27 NOTE — ED Provider Notes (Signed)
CSN: [id number]     Arrival date & time 04/27/15  1318 History   First MD Initiated Contact with Patient 04/27/15 1450     Chief Complaint  Patient presents with  . URI   (Consider location/radiation/quality/duration/timing/severity/associated sxs/prior Treatment) HPI She is a 35 year old woman here for evaluation of flulike symptoms. She states last night she developed fever to 100.6, headache, body aches, cough. She reports a decreased appetite and some nausea.  No vomiting.  She does report some pain in her chest with coughing only. She has been taking ibuprofen which does help the headache and body aches. Her daughter was diagnosed with the flu last week.  Past Medical History  Diagnosis Date  . Infection     Yeast inf;Not frequent lately  . Blood type, Rh negative B -  . Group B streptococcal infection in pregnancy 2006  . Unsure of LMP (last menstrual period) as reason for ultrasound scan 12/07/2011    EDC by 7week u/s  . Gonorrhea complicating pregnancy in first trimester 12/09/2011    Positive cx on pap done 12/07/11  . GBS (group B Streptococcus carrier), +RV culture, currently pregnant 06/30/2012    C&S showed Resistant to EES and Clindamycin; sensitive to Vanc  . NSVD (normal spontaneous vaginal delivery) 07/08/2012  . 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 Wisdom tooth extraction  04/2011    All 4 removed   Family History  Problem Relation Age of Onset  . Thyroid disease Mother     Hypothyroid  . Migraines Maternal Grandmother   . Diabetes Maternal Grandmother    Social History  Substance Use Topics  . Smoking status: Never Smoker   . Smokeless tobacco: Never Used  . Alcohol Use: No     Comment: Socially/once a month   OB History    Gravida Para Term Preterm AB TAB SAB Ectopic Multiple Living   Review of  Systems As in history of present illness Allergies  Amoxicillin and Penicillins  Home Medications   Prior to Admission medications   Medication Sig Start Date End Date Taking? Authorizing Provider  acetaminophen (TYLENOL) 500 MG tablet Take 500 mg by mouth every 6 (six) hours as needed. For pain    Historical Provider, MD  HYDROcodone-homatropine (HYCODAN) 5-1.5 MG/5ML syrup Take 5 mLs by mouth every 6 (six) hours as needed for cough. 04/27/15   Charm Rings, MD  ibuprofen (ADVIL,MOTRIN) 600 MG tablet Take 1 tablet (600 mg total) by mouth every 6 (six) hours. 07/10/12   Haroldine Laws, CNM  ondansetron (ZOFRAN) 4 MG tablet Take 1 tablet (4 mg total) by mouth every 8 (eight) hours as needed for nausea or vomiting. 04/27/15   Charm Rings, MD  oseltamivir (TAMIFLU) 75 MG capsule Take 1 capsule (75 mg total) by mouth every 12 (twelve) hours. 04/27/15   Charm Rings, MD  Prenatal Vit-Fe Fumarate-FA (PRENATAL MULTIVITAMIN) TABS Take 1 tablet by mouth at bedtime.    Historical Provider, MD   Meds Ordered and Administered this Visit  Medications - No data to display  BP 124/77 mmHg  Pulse 74  Temp(Src) 99 F (37.2 C) (Oral)  Resp 16  SpO2 99%  LMP 04/03/2015  Breastfeeding? No No data found.   Physical Exam  Constitutional: She is oriented to person, place, and time. She appears well-developed and well-nourished. No distress.  HENT:  Nose: Nose normal.  Mouth/Throat: Oropharynx is clear and moist. No oropharyngeal exudate.  Neck: Neck supple.  Cardiovascular: Normal rate, regular rhythm and normal heart sounds.   Pulmonary/Chest: Effort normal and breath sounds normal. No respiratory distress. She has no wheezes. She has no rales.  Lymphadenopathy:    She has no cervical adenopathy.  Neurological: She is alert and oriented to person, place, and time.    ED Course  Procedures (including critical care time)  Labs Review Labs Reviewed - No data to display  Imaging Review No  results found.    MDM   1. Influenza    Symptomatic treatment with rest, fluids, ibuprofen, Hycodan, and Zofran. Prescription provided for Tamiflu. Discussed that this does not cure the flu, but shortens duration of symptoms. Follow-up as needed.    Charm Rings, MD 04/27/15 [phone]

## 2015-04-27 NOTE — ED Notes (Signed)
C/o cold sx onset yest night associated w/HA, BA, abd pain, fever of 100.6 last night, dry cough and CP due to cough Taking Metronidazole for trich A&O x4... No acute distress.

## 2015-04-27 NOTE — Discharge Instructions (Signed)
Influenza, Adult Influenza (flu) is an infection in the mouth, nose, and throat (respiratory tract) caused by a virus. The flu can make you feel very ill. Influenza spreads easily from person to person (contagious).  HOME CARE   Only take medicines as told by your doctor.  Use a cool mist humidifier to make breathing easier.  Get plenty of rest until your fever goes away. This usually takes 3 to 4 days.  Drink enough fluids to keep your pee (urine) clear or pale yellow.  Cover your mouth and nose when you cough or sneeze.  Wash your hands well to avoid spreading the flu.  Stay home from work or school until your fever has been gone for at least 1 full day.  Get a flu shot every year. GET HELP RIGHT AWAY IF:   You have trouble breathing or feel short of breath.  Your skin or nails turn blue.  You have severe neck pain or stiffness.  You have a severe headache, facial pain, or earache.  Your fever gets worse or keeps coming back.  You feel sick to your stomach (nauseous), throw up (vomit), or have watery poop (diarrhea).  You have chest pain.  You have a deep cough that gets worse, or you cough up more thick spit (mucus). MAKE SURE YOU:   Understand these instructions.  Will watch your condition.  Will get help right away if you are not doing well or get worse.   This information is not intended to replace advice given to you by your health care provider. Make sure you discuss any questions you have with your health care provider.   Document Released: 11/25/2007 Document Revised: 03/08/2014 Document Reviewed: 05/17/2011 Elsevier Interactive Patient Education 2016 [employer].  Use the Zofran every 8 hours as needed for nausea. Use Hycodan every 4-6 hours as needed for cough. Do not drive while taking this medicine. You can take the Tamiflu twice a day for 5 days. This will shorten duration of symptoms by 12-24 hours. Follow-up as needed.

## 2015-06-25 ENCOUNTER — Other Ambulatory Visit: Payer: Self-pay | Admitting: Family Medicine

## 2015-06-25 ENCOUNTER — Other Ambulatory Visit (HOSPITAL_COMMUNITY)
Admission: RE | Admit: 2015-06-25 | Discharge: 2015-06-25 | Disposition: A | Discharge status: Home / Self Care | Payer: Medicaid Other | Source: Ambulatory Visit | Attending: Family Medicine | Admitting: Family Medicine

## 2015-06-25 DIAGNOSIS — Z1151 Encounter for screening for human papillomavirus (HPV): Secondary | ICD-10-CM | POA: Diagnosis present

## 2015-06-25 DIAGNOSIS — Z01419 Encounter for gynecological examination (general) (routine) without abnormal findings: Secondary | ICD-10-CM | POA: Diagnosis present

## 2015-06-26 LAB — CYTOLOGY - PAP

## 2015-07-23 ENCOUNTER — Encounter (HOSPITAL_COMMUNITY): Payer: Self-pay | Admitting: Emergency Medicine

## 2015-07-23 ENCOUNTER — Ambulatory Visit (HOSPITAL_COMMUNITY)
Admission: EM | Admit: 2015-07-23 | Discharge: 2015-07-23 | Disposition: A | Discharge status: Home / Self Care | Payer: Medicaid Other | Attending: Family Medicine | Admitting: Family Medicine

## 2015-07-23 DIAGNOSIS — B86 Scabies: Secondary | ICD-10-CM | POA: Diagnosis not present

## 2015-07-23 MED ORDER — PERMETHRIN 5 % EX CREA: TOPICAL_CREAM | CUTANEOUS | Status: DC

## 2015-07-23 NOTE — ED Provider Notes (Signed)
CSN: [id number]     Arrival date & time 07/23/15  1839 History   First MD Initiated Contact with Patient 07/23/15 1902     Chief Complaint  Patient presents with  . Rash   (Consider location/radiation/quality/duration/timing/severity/associated sxs/prior Treatment) HPI Comments: 35 year old female complains of an itchy rash to both hands and forearms and lesser to the abdomen and lower extremities for 4 days. She states that she keeps small children in her home. Denies systemic symptoms.   Past Medical History  Diagnosis Date  . Infection     Yeast inf;Not frequent lately  . Blood type, Rh negative B -  . Group B streptococcal infection in pregnancy 2006  . Unsure of LMP (last menstrual period) as reason for ultrasound scan 12/07/2011    EDC by 7week u/s  . Gonorrhea complicating pregnancy in first trimester 12/09/2011    Positive cx on pap done 12/07/11  . GBS (group B Streptococcus carrier), +RV culture, currently pregnant 06/30/2012    C&S showed Resistant to EES and Clindamycin; sensitive to Vanc  . NSVD (normal spontaneous vaginal delivery) 07/08/2012  . 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 Wisdom tooth extraction  04/2011    All 4 removed   Family History  Problem Relation Age of Onset  . Thyroid disease Mother     Hypothyroid  . Migraines Maternal Grandmother   . Diabetes Maternal Grandmother    Social History  Substance Use Topics  . Smoking status: Never Smoker   . Smokeless tobacco: Never Used  . Alcohol Use: No     Comment: Socially/once a month   OB History    Gravida Para Term Preterm AB TAB SAB Ectopic Multiple Living   3 3 3       3      Review of Systems  Constitutional: Negative.   HENT: Negative.   Eyes: Negative.   Respiratory: Negative.   Skin: Positive for rash.  Neurological: Negative.   All other systems  reviewed and are negative.   Allergies  Amoxicillin and Penicillins  Home Medications   Prior to Admission medications   Medication Sig Start Date End Date Taking? Authorizing Provider  acetaminophen (TYLENOL) 500 MG tablet Take 500 mg by mouth every 6 (six) hours as needed. For pain    Historical Provider, MD  HYDROcodone-homatropine (HYCODAN) 5-1.5 MG/5ML syrup Take 5 mLs by mouth every 6 (six) hours as needed for cough. 04/27/15   Charm RingsErin J Honig, MD  ibuprofen (ADVIL,MOTRIN) 600 MG tablet Take 1 tablet (600 mg total) by mouth every 6 (six) hours. 07/10/12   Haroldine LawsJennifer Oxley, CNM  ondansetron (ZOFRAN) 4 MG tablet Take 1 tablet (4 mg total) by mouth every 8 (eight) hours as needed for nausea or vomiting. 04/27/15   Charm RingsErin J Honig, MD  oseltamivir (TAMIFLU) 75 MG capsule Take 1 capsule (75 mg total) by mouth every 12 (twelve) hours. 04/27/15   Charm RingsErin J Honig, MD  permethrin (ELIMITE) 5 % cream Apply to affected area, leave on for 8 hours then rinse. Repeat in 7 days. 07/23/15   Hayden Rasmussenavid Analena Gama, NP  Prenatal Vit-Fe Fumarate-FA (PRENATAL MULTIVITAMIN) TABS Take 1 tablet by mouth at bedtime.    Historical Provider, MD   Meds Ordered and Administered this Visit  Medications - No data to display  BP 136/103 mmHg  Pulse 66  Temp(Src) 98.4 F (36.9 C) (Oral)  Resp 18  SpO2 99%  LMP 07/09/2015 (Exact Date) No data found.   Physical Exam  Constitutional: She is oriented to person, place, and time. She appears well-developed and well-nourished. No distress.  Eyes: EOM are normal.  Neck: Normal range of motion. Neck supple.  Cardiovascular: Normal rate.   Pulmonary/Chest: Effort normal. No respiratory distress.  Musculoskeletal: She exhibits no edema.  Neurological: She is alert and oriented to person, place, and time. She exhibits normal muscle tone.  Skin: Skin is warm and dry.  Multiple small flesh-colored papules to the hands, upper extremities and lesser to the torso. Several marks to the skin  consistent with burrowing.  Psychiatric: She has a normal mood and affect.  Nursing note and vitals reviewed.   ED Course  Procedures (including critical care time)  Labs Review Labs Reviewed - No data to display  Imaging Review No results found.   Visual Acuity Review  Right Eye Distance:   Left Eye Distance:   Bilateral Distance:    Right Eye Near:   Left Eye Near:    Bilateral Near:         MDM   1. Scabies    Meds ordered this encounter  Medications  . permethrin (ELIMITE) 5 % cream    Sig: Apply to affected area, leave on for 8 hours then rinse. Repeat in 7 days.    Dispense:  120 g    Refill:  0    Order Specific Question:  Supervising Provider    Answer:  Linna Hoff [[id number]]   Information on scabies infestation . Po antihistamines.    Hayden Rasmussen, NP 07/23/15 1946

## 2015-07-23 NOTE — ED Notes (Signed)
[facility] a [facility] rash on both of her arms x 4 days.

## 2015-07-23 NOTE — Discharge Instructions (Signed)

## 2015-08-26 ENCOUNTER — Ambulatory Visit (HOSPITAL_COMMUNITY)
Admission: EM | Admit: 2015-08-26 | Discharge: 2015-08-26 | Disposition: A | Discharge status: Home / Self Care | Payer: Medicaid Other | Attending: Family Medicine | Admitting: Family Medicine

## 2015-08-26 ENCOUNTER — Encounter (HOSPITAL_COMMUNITY): Payer: Self-pay | Admitting: Emergency Medicine

## 2015-08-26 DIAGNOSIS — B86 Scabies: Secondary | ICD-10-CM

## 2015-08-26 MED ORDER — PERMETHRIN 5 % EX CREA: TOPICAL_CREAM | CUTANEOUS | Status: DC

## 2015-08-26 NOTE — ED Provider Notes (Signed)
CSN: [id number]     Arrival date & time 08/26/15  1540 History   First MD Initiated Contact with Patient 08/26/15 1631     Chief Complaint  Patient presents with  . Rash   (Consider location/radiation/quality/duration/timing/severity/associated sxs/prior Treatment) HPI History obtained from patient: Location:  Skin Context/Duration: Onset approximately one month ago  Severity: No pain, just itching  Quality: Itching Timing:       Constant, worse after showers     Home Treatment: Treated in urgent care for scabies one month ago Associated symptoms:  States that she has seen the bugs crawling on her skin after showers. Family History: Hypertension    Past Medical History  Diagnosis Date  . Infection     Yeast inf;Not frequent lately  . Blood type, Rh negative B -  . Group B streptococcal infection in pregnancy 2006  . Unsure of LMP (last menstrual period) as reason for ultrasound scan 12/07/2011    EDC by 7week u/s  . Gonorrhea complicating pregnancy in first trimester 12/09/2011    Positive cx on pap done 12/07/11  . GBS (group B Streptococcus carrier), +RV culture, currently pregnant 06/30/2012    C&S showed Resistant to EES and Clindamycin; sensitive to Vanc  . NSVD (normal spontaneous vaginal delivery) 07/08/2012  . 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 Wisdom tooth extraction  04/2011    All 4 removed   Family History  Problem Relation Age of Onset  . Thyroid disease Mother     Hypothyroid  . Migraines Maternal Grandmother   . Diabetes Maternal Grandmother    Social History  Substance Use Topics  . Smoking status: Never Smoker   . Smokeless tobacco: Never Used  . Alcohol Use: No     Comment: Socially/once a month   OB History    Gravida Para Term Preterm AB TAB SAB Ectopic Multiple Living   3 3 3       3      Review of Systems  Denies: HEADACHE, NAUSEA, ABDOMINAL PAIN, CHEST PAIN, CONGESTION, DYSURIA, SHORTNESS OF BREATH  Allergies  Amoxicillin and Penicillins  Home Medications   Prior to Admission medications   Medication Sig Start Date End Date Taking? Authorizing Provider  acetaminophen (TYLENOL) 500 MG tablet Take 500 mg by mouth every 6 (six) hours as needed. For pain    Historical Provider, MD  HYDROcodone-homatropine (HYCODAN) 5-1.5 MG/5ML syrup Take 5 mLs by mouth every 6 (six) hours as needed for cough. 04/27/15   Charm RingsErin J Honig, MD  ibuprofen (ADVIL,MOTRIN) 600 MG tablet Take 1 tablet (600 mg total) by mouth every 6 (six) hours. 07/10/12   Haroldine LawsJennifer Oxley, CNM  ondansetron (ZOFRAN) 4 MG tablet Take 1 tablet (4 mg total) by mouth every 8 (eight) hours as needed for nausea or vomiting. 04/27/15   Charm RingsErin J Honig, MD  oseltamivir (TAMIFLU) 75 MG capsule Take 1 capsule (75 mg total) by mouth every 12 (twelve) hours. 04/27/15   Charm RingsErin J Honig, MD  permethrin (ELIMITE) 5 % cream Apply to affected area, leave on for 8 hours then rinse. Repeat in 7 days. 07/23/15   Hayden Rasmussenavid Mabe, NP  permethrin [place](ELIMITE) 5 % cream Apply to affected area once 08/26/15   Tharon AquasFrank C Neshawn Aird, PA  Prenatal Vit-Fe Fumarate-FA (PRENATAL MULTIVITAMIN) TABS Take 1 tablet by mouth at bedtime.  Historical Provider, MD   Meds Ordered and Administered this Visit  Medications - No data to display  BP 134/93 mmHg  Pulse 82  Temp(Src) 97.2 F (36.2 C) (Oral)  Resp 18  SpO2 100%  LMP 07/31/2015 No data found.   Physical Exam NURSES NOTES AND VITAL SIGNS REVIEWED. CONSTITUTIONAL: Well developed, well nourished, no acute distress HEENT: normocephalic, atraumatic EYES: Conjunctiva normal NECK:normal ROM, supple, no adenopathy PULMONARY:No respiratory distress, normal effort ABDOMINAL: Soft, ND, NT BS+, No CVAT MUSCULOSKELETAL: Normal ROM of all extremities,  SKIN: warm and dry without rash, no lesions consistent with scabies (pt insists she has seen  the bugs crawling on her skin) PSYCHIATRIC: Mood and affect, behavior are normal   ED Course  Procedures (including critical care time)  Labs Review Labs Reviewed - No data to display  Imaging Review No results found.   Visual Acuity Review  Right Eye Distance:   Left Eye Distance:   Bilateral Distance:    Right Eye Near:   Left Eye Near:    Bilateral Near:     Long discussion with patient that she does not appear to have scabies lesions. She is insistent that she does. States that she would like to be treated as she has seen the bugs. She also states that the provider that has seen her before did not give her a hard time about the diagnosis.  Rx Elimite cream  MDM   1. Scabies     Patient is reassured that there are no issues that require transfer to higher level of care at this time or additional tests. Patient is advised to continue home symptomatic treatment. Patient is advised that if there are new or worsening symptoms to attend the emergency department, contact primary care provider, or return to UC. Instructions of care provided discharged home in stable condition.    THIS NOTE WAS GENERATED USING A VOICE RECOGNITION SOFTWARE PROGRAM. ALL REASONABLE EFFORTS  WERE MADE TO PROOFREAD THIS DOCUMENT FOR ACCURACY.  I have verbally reviewed the discharge instructions with the patient. A printed AVS was given to the patient.  All questions were answered prior to discharge.      Tharon AquasFrank C Basil Buffin, PA 08/26/15 1719

## 2015-08-26 NOTE — ED Notes (Signed)
Patient reports she and her children have scabies and have attempted treatment, but patient reports her children are spending time with their father and not sure if there is reinfection.

## 2015-08-26 NOTE — Discharge Instructions (Signed)

## 2016-02-02 ENCOUNTER — Other Ambulatory Visit (HOSPITAL_COMMUNITY)
Admission: RE | Admit: 2016-02-02 | Discharge: 2016-02-02 | Disposition: A | Discharge status: Home / Self Care | Payer: Medicaid Other | Source: Ambulatory Visit | Attending: Family Medicine | Admitting: Family Medicine

## 2016-02-02 ENCOUNTER — Other Ambulatory Visit: Payer: Self-pay | Admitting: Family Medicine

## 2016-02-02 DIAGNOSIS — Z113 Encounter for screening for infections with a predominantly sexual mode of transmission: Secondary | ICD-10-CM | POA: Insufficient documentation

## 2016-02-05 LAB — URINE CYTOLOGY ANCILLARY ONLY
Bacterial vaginitis: NEGATIVE
CANDIDA VAGINITIS: POSITIVE — AB
CHLAMYDIA, DNA PROBE: NEGATIVE
NEISSERIA GONORRHEA: NEGATIVE
Trichomonas: NEGATIVE

## 2016-04-12 ENCOUNTER — Other Ambulatory Visit: Payer: Self-pay | Admitting: Family Medicine

## 2016-04-12 ENCOUNTER — Other Ambulatory Visit (HOSPITAL_COMMUNITY)
Admission: RE | Admit: 2016-04-12 | Discharge: 2016-04-12 | Disposition: A | Discharge status: Home / Self Care | Payer: Medicaid Other | Source: Ambulatory Visit | Attending: Family Medicine | Admitting: Family Medicine

## 2016-04-12 DIAGNOSIS — Z113 Encounter for screening for infections with a predominantly sexual mode of transmission: Secondary | ICD-10-CM | POA: Insufficient documentation

## 2016-04-16 LAB — URINE CYTOLOGY ANCILLARY ONLY
BACTERIAL VAGINITIS: NEGATIVE
CANDIDA VAGINITIS: POSITIVE — AB
Chlamydia: NEGATIVE
NEISSERIA GONORRHEA: NEGATIVE
TRICH (WINDOWPATH): NEGATIVE

## 2016-09-12 ENCOUNTER — Inpatient Hospital Stay (HOSPITAL_COMMUNITY): Payer: Medicaid Other

## 2016-09-12 ENCOUNTER — Encounter: Payer: Self-pay | Admitting: Medical

## 2016-09-12 ENCOUNTER — Inpatient Hospital Stay (HOSPITAL_COMMUNITY)
Admission: AD | Admit: 2016-09-12 | Discharge: 2016-09-12 | Disposition: A | Discharge status: Home / Self Care | Payer: Medicaid Other | Source: Ambulatory Visit | Attending: Obstetrics & Gynecology | Admitting: Obstetrics & Gynecology

## 2016-09-12 DIAGNOSIS — Z3A01 Less than 8 weeks gestation of pregnancy: Secondary | ICD-10-CM | POA: Diagnosis not present

## 2016-09-12 DIAGNOSIS — O26851 Spotting complicating pregnancy, first trimester: Secondary | ICD-10-CM | POA: Diagnosis present

## 2016-09-12 DIAGNOSIS — O209 Hemorrhage in early pregnancy, unspecified: Secondary | ICD-10-CM | POA: Diagnosis not present

## 2016-09-12 DIAGNOSIS — R109 Unspecified abdominal pain: Secondary | ICD-10-CM | POA: Insufficient documentation

## 2016-09-12 DIAGNOSIS — O3680X0 Pregnancy with inconclusive fetal viability, not applicable or unspecified: Secondary | ICD-10-CM

## 2016-09-12 DIAGNOSIS — Z880 Allergy status to penicillin: Secondary | ICD-10-CM | POA: Diagnosis not present

## 2016-09-12 DIAGNOSIS — O26859 Spotting complicating pregnancy, unspecified trimester: Secondary | ICD-10-CM

## 2016-09-12 HISTORY — DX: Depression, unspecified: F32.A

## 2016-09-12 HISTORY — DX: Unspecified asthma, uncomplicated: J45.909

## 2016-09-12 HISTORY — DX: Herpesviral infection of urogenital system, unspecified: A60.00

## 2016-09-12 HISTORY — DX: Major depressive disorder, single episode, unspecified: F32.9

## 2016-09-12 HISTORY — DX: Trichomoniasis, unspecified: A59.9

## 2016-09-12 LAB — CBC WITH DIFFERENTIAL/PLATELET
BASOS ABS: 0 10*3/uL (ref 0.0–0.1)
Basophils Relative: 0 %
Eosinophils Absolute: 0.3 10*3/uL (ref 0.0–0.7)
Eosinophils Relative: 3 %
HEMATOCRIT: 42.4 % (ref 36.0–46.0)
Hemoglobin: 14.8 g/dL (ref 12.0–15.0)
LYMPHS ABS: 2.8 10*3/uL (ref 0.7–4.0)
LYMPHS PCT: 23 %
MCH: 30.5 pg (ref 26.0–34.0)
MCHC: 34.9 g/dL (ref 30.0–36.0)
MCV: 87.4 fL (ref 78.0–100.0)
MONOS PCT: 2 %
Monocytes Absolute: 0.3 10*3/uL (ref 0.1–1.0)
NEUTROS ABS: 8.7 10*3/uL — AB (ref 1.7–7.7)
Neutrophils Relative %: 72 %
Platelets: 230 10*3/uL (ref 150–400)
RBC: 4.85 MIL/uL (ref 3.87–5.11)
RDW: 13.9 % (ref 11.5–15.5)
WBC: 12.1 10*3/uL — ABNORMAL HIGH (ref 4.0–10.5)

## 2016-09-12 LAB — POCT PREGNANCY, URINE: PREG TEST UR: POSITIVE — AB

## 2016-09-12 LAB — WET PREP, GENITAL
CLUE CELLS WET PREP: NONE SEEN
SPERM: NONE SEEN
TRICH WET PREP: NONE SEEN
Yeast Wet Prep HPF POC: NONE SEEN

## 2016-09-12 LAB — HCG, QUANTITATIVE, PREGNANCY: hCG, Beta Chain, Quant, S: 8083 m[IU]/mL — ABNORMAL HIGH (ref <5)

## 2016-09-12 MED ORDER — RHO D IMMUNE GLOBULIN 1500 UNIT/2ML IJ SOSY
300.0000 ug | PREFILLED_SYRINGE | Freq: Once | INTRAMUSCULAR | Status: AC
  Administered 2016-09-12: 300 ug via INTRAMUSCULAR
  Filled 2016-09-12: qty 2

## 2016-09-12 NOTE — MAU Provider Note (Signed)
History     CSN: [id number]  Arrival date and time: 09/12/16 1542   First Provider Initiated Contact with Patient 09/12/16 1635      Chief Complaint  Patient presents with  . Vaginal Bleeding  . Abdominal Pain  . Possible Pregnancy   HPI Tami [name] is a 36 y.o. [id number] at [redacted]w[redacted]d who presents to MAU today with complaint of spotting today. She has also had intermittent lower abdominal pain x 1 week. She has not taken anything for pain. She states pain is mild. She denies vaginal discharge, heavy bleeding, UTI symptoms or fever. She has had mild intermittent nausea without vomiting, diarrhea, constipation. Last intercourse was 2-3 weeks ago. LMP 07/26/16.   OB History    Gravida Para Term Preterm AB Living   5 3 3   1 3    SAB TAB Ectopic Multiple Live Births     1     3      Past Medical History:  Diagnosis Date  . Asthma    as child  . Blood type, Rh negative B -  . Depression    ok  . GBS (group B Streptococcus carrier), +RV culture, currently pregnant 06/30/2012   C&S showed Resistant to EES and Clindamycin; sensitive to Vanc  . Genital herpes    rare outbreaks  . Gonorrhea complicating pregnancy in first trimester 12/09/2011   Positive cx on pap done 12/07/11  . Group B streptococcal infection in pregnancy 2006  . 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 Infection    Yeast inf;Not frequent lately  . NSVD (normal spontaneous vaginal delivery) 07/08/2012  . Trichomonas infection   . Type B blood, Rh negative 12/07/2011  . Unsure of LMP (last menstrual period) as reason for ultrasound scan 12/07/2011   EDC by 7week u/s    Past Surgical History:  Procedure Laterality Date  . THERAPEUTIC ABORTION    . WISDOM TOOTH EXTRACTION  04/2011   All 4 removed    Family History  Problem Relation Age of Onset  . Thyroid disease Mother        Hypothyroid  . Migraines Maternal Grandmother   . Diabetes Maternal Grandmother   .  Asthma Daughter   . Stroke Paternal Grandmother     Social History  Substance Use Topics  . Smoking status: Never Smoker  . Smokeless tobacco: Never Used  . Alcohol use No     Comment: Socially/once a month    Allergies:  Allergies  Allergen Reactions  . Amoxicillin Hives  . Penicillins Hives    Prescriptions Prior to Admission  Medication Sig Dispense Refill Last Dose  . acetaminophen (TYLENOL) 500 MG tablet Take 500 mg by mouth every 6 (six) hours as needed. For pain   Unknown at Unknown time  . HYDROcodone-homatropine (HYCODAN) 5-1.5 MG/5ML syrup Take 5 mLs by mouth every 6 (six) hours as needed for cough. 120 mL 0 Unknown at Unknown time  . ibuprofen (ADVIL,MOTRIN) 600 MG tablet Take 1 tablet (600 mg total) by mouth every 6 (six) hours. 30 tablet 1 Unknown at Unknown time  . ondansetron (ZOFRAN) 4 MG tablet Take 1 tablet (4 mg total) by mouth every 8 (eight) hours as needed for nausea or vomiting. 20 tablet 0 Unknown at Unknown time  . oseltamivir (TAMIFLU) 75 MG capsule Take 1 capsule (75 mg total) by mouth every 12 (twelve) hours. 10 capsule 0 Unknown at Unknown time  .  permethrin (ELIMITE) 5 % cream Apply to affected area, leave on for 8 hours then rinse. Repeat in 7 days. 120 g 0 Unknown at Unknown time  . permethrin (ELIMITE) 5 % cream Apply to affected area once 60 g 1   . Prenatal Vit-Fe Fumarate-FA (PRENATAL MULTIVITAMIN) TABS Take 1 tablet by mouth at bedtime.   Unknown at Unknown time    Review of Systems  Constitutional: Negative for fever.  Gastrointestinal: Positive for abdominal pain and nausea. Negative for constipation, diarrhea and vomiting.  Genitourinary: Positive for vaginal bleeding. Negative for dysuria, frequency, urgency and vaginal discharge.   Physical Exam   Blood pressure 134/85, pulse 77, temperature 98.4 F (36.9 C), temperature source Oral, resp. rate 16, height 5\' 2"  (1.575 m), weight 195 lb 4 oz (88.6 kg), last menstrual period 07/26/2016,  SpO2 98 %.  Physical Exam  Nursing note and vitals reviewed. Constitutional: She is oriented to person, place, and time. She appears well-developed and well-nourished. No distress.  HENT:  Head: Normocephalic and atraumatic.  Cardiovascular: Normal rate.   Respiratory: Effort normal.  GI: Soft. She exhibits no distension and no mass. There is no tenderness. There is no rebound and no guarding.  Genitourinary: Uterus is not enlarged and not tender. Cervix exhibits no motion tenderness, no discharge and no friability. Right adnexum displays no mass and no tenderness. Left adnexum displays no mass and no tenderness. No bleeding in the vagina. Vaginal discharge (small white) found.  Genitourinary Comments: Cervix: closed, thick, firm  Neurological: She is alert and oriented to person, place, and time.  Skin: Skin is warm and dry. No erythema.  Psychiatric: She has a normal mood and affect.    Results for orders placed or performed during the hospital encounter of 09/12/16 (from the past 24 hour(s))  Pregnancy, urine POC     Status: Abnormal   Collection Time: 09/12/16  4:02 PM  Result Value Ref Range   Preg Test, Ur POSITIVE (A) NEGATIVE  CBC with Differential/Platelet     Status: Abnormal   Collection Time: 09/12/16  4:44 PM  Result Value Ref Range   WBC 12.1 (H) 4.0 - 10.5 K/uL   RBC 4.85 3.87 - 5.11 MIL/uL   Hemoglobin 14.8 12.0 - 15.0 g/dL   HCT 28.4 13.2 - 44.0 %   MCV 87.4 78.0 - 100.0 fL   MCH 30.5 26.0 - 34.0 pg   MCHC 34.9 30.0 - 36.0 g/dL   RDW 10.2 72.5 - 36.6 %   Platelets 230 150 - 400 K/uL   Neutrophils Relative % 72 %   Neutro Abs 8.7 (H) 1.7 - 7.7 K/uL   Lymphocytes Relative 23 %   Lymphs Abs 2.8 0.7 - 4.0 K/uL   Monocytes Relative 2 %   Monocytes Absolute 0.3 0.1 - 1.0 K/uL   Eosinophils Relative 3 %   Eosinophils Absolute 0.3 0.0 - 0.7 K/uL   Basophils Relative 0 %   Basophils Absolute 0.0 0.0 - 0.1 K/uL  hCG, quantitative, pregnancy     Status: Abnormal    Collection Time: 09/12/16  4:44 PM  Result Value Ref Range   hCG, Beta Chain, Quant, S 8,083 (H) <5 mIU/mL  Rh IG workup (includes ABO/Rh)     Status: None (Preliminary result)   Collection Time: 09/12/16  4:44 PM  Result Value Ref Range   Gestational Age(Wks) 6    ABO/RH(D) B NEG    Antibody Screen NEG    Unit Number [id number]  Blood Component Type RHIG    Unit division 00    Status of Unit ISSUED    Transfusion Status OK TO TRANSFUSE   Wet prep, genital     Status: Abnormal   Collection Time: 09/12/16  4:50 PM  Result Value Ref Range   Yeast Wet Prep HPF POC NONE SEEN NONE SEEN   Trich, Wet Prep NONE SEEN NONE SEEN   Clue Cells Wet Prep HPF POC NONE SEEN NONE SEEN   WBC, Wet Prep HPF POC MANY (A) NONE SEEN   Sperm NONE SEEN    [place] Ob Comp Less 14 Wks  Result Date: 09/12/2016 CLINICAL DATA:  Vaginal bleeding before [redacted] weeks gestation, uncertain LMP, quantitative beta HCG pending EXAM: OBSTETRIC <14 WK US AND TRANSVAGINAL OB US TECHNIQUE: Both transabdominal and transvaginal ultrasound examinations were performed for complete evaluation of the gestation as well as the maternal uterus, adnexal regions, and pelvic cul-de-sac. Transvaginal technique was performed to assess early pregnancy. COMPARISON:  None for this gestation FINDINGS: Intrauterine gestational sac: Present Yolk sac:  Not definitely visualized Embryo:  Absent Cardiac Activity: N/A Heart Rate: N/A  bpm MSD: 11.38  mm   5 w   6  d Subchorionic hemorrhage:  None visualized. Maternal uterus/adnexae: RIGHT ovary measures 3.3 x 3.6 x 2.4 cm and contains a corpus luteum. LEFT ovary normal size and morphology, 2.9 x 2.8 x 2.1 cm. No free pelvic fluid or adnexal masses. IMPRESSION: Gestational sac within the uterus without definite visualization of a yolk sac or fetal pole. No extrauterine abnormalities identified. Electronically Signed   By: Ulyses SouthwardMark  Boles M.D.   On: 09/12/2016 17:48   [place] Ob Transvaginal  Result Date:  09/12/2016 CLINICAL DATA:  Vaginal bleeding before [redacted] weeks gestation, uncertain LMP, quantitative beta HCG pending EXAM: OBSTETRIC <14 WK US AND TRANSVAGINAL OB US TECHNIQUE: Both transabdominal and transvaginal ultrasound examinations were performed for complete evaluation of the gestation as well as the maternal uterus, adnexal regions, and pelvic cul-de-sac. Transvaginal technique was performed to assess early pregnancy. COMPARISON:  None for this gestation FINDINGS: Intrauterine gestational sac: Present Yolk sac:  Not definitely visualized Embryo:  Absent Cardiac Activity: N/A Heart Rate: N/A  bpm MSD: 11.38  mm   5 w   6  d Subchorionic hemorrhage:  None visualized. Maternal uterus/adnexae: RIGHT ovary measures 3.3 x 3.6 x 2.4 cm and contains a corpus luteum. LEFT ovary normal size and morphology, 2.9 x 2.8 x 2.1 cm. No free pelvic fluid or adnexal masses. IMPRESSION: Gestational sac within the uterus without definite visualization of a yolk sac or fetal pole. No extrauterine abnormalities identified. Electronically Signed   By: Ulyses SouthwardMark  Boles M.D.   On: 09/12/2016 17:48    MAU Course  Procedures None  MDM +UPT UA, wet prep, GC/chlamydia, CBC, quant hCG, HIV, RPR and US today to rule out ectopic pregnancy B negative blood type - rhogam work-up ordered  Rhogam given today Since YS not definitely identified - pregnancy of unknown location, will have patient follow-up for labs in the office on Wednesday.   Assessment and Plan  A: Pregnancy of unknown location  Spotting in pregnancy Abdominal pain in pregnancy  P: Discharge home Tylenol PRN fo rpain  Ectopic/bleeding precautions discussed Patient advised to follow-up with [facility] on Wednesday at 11:00 am for repeat labs Patient may return to MAU as needed or if her condition were to change or worsen   Vonzella NippleJulie Wenzel, PA-C 09/12/2016, 6:01 PM

## 2016-09-12 NOTE — MAU Note (Signed)
Urine in lab 

## 2016-09-12 NOTE — MAU Note (Signed)
Has had some cramping in lower abd.  Noted some pinkish spotting today.  preg confirmed at Dr Tedra SenegalBland's office, last week

## 2016-09-12 NOTE — Discharge Instructions (Signed)
First Trimester of Pregnancy The first trimester of pregnancy is from week 1 until the end of week 13 (months 1 through 3). During this time, your baby will begin to develop inside you. At 6-8 weeks, the eyes and face are formed, and the heartbeat can be seen on ultrasound. At the end of 12 weeks, all the baby's organs are formed. Prenatal care is all the medical care you receive before the birth of your baby. Make sure you get good prenatal care and follow all of your doctor's instructions. Follow these instructions at home: Medicines  Take over-the-counter and prescription medicines only as told by your doctor. Some medicines are safe and some medicines are not safe during pregnancy.  Take a prenatal vitamin that contains at least 600 micrograms (mcg) of folic acid.  If you have trouble pooping (constipation), take medicine that will make your stool soft (stool softener) if your doctor approves. Eating and drinking  Eat regular, healthy meals.  Your doctor will tell you the amount of weight gain that is right for you.  Avoid raw meat and uncooked cheese.  If you feel sick to your stomach (nauseous) or throw up (vomit): ? Eat 4 or 5 small meals a day instead of 3 large meals. ? Try eating a few soda crackers. ? Drink liquids between meals instead of during meals.  To prevent constipation: ? Eat foods that are high in fiber, like fresh fruits and vegetables, whole grains, and beans. ? Drink enough fluids to keep your pee (urine) clear or pale yellow. Activity  Exercise only as told by your doctor. Stop exercising if you have cramps or pain in your lower belly (abdomen) or low back.  Do not exercise if it is too hot, too humid, or if you are in a place of great height (high altitude).  Try to avoid standing for long periods of time. Move your legs often if you must stand in one place for a long time.  Avoid heavy lifting.  Wear low-heeled shoes. Sit and stand up straight.  You  can have sex unless your doctor tells you not to. Relieving pain and discomfort  Wear a good support bra if your breasts are sore.  Take warm water baths (sitz baths) to soothe pain or discomfort caused by hemorrhoids. Use hemorrhoid cream if your doctor says it is okay.  Rest with your legs raised if you have leg cramps or low back pain.  If you have puffy, bulging veins (varicose veins) in your legs: ? Wear support hose or compression stockings as told by your doctor. ? Raise (elevate) your feet for 15 minutes, 3-4 times a day. ? Limit salt in your food. Prenatal care  Schedule your prenatal visits by the twelfth week of pregnancy.  Write down your questions. Take them to your prenatal visits.  Keep all your prenatal visits as told by your doctor. This is important. Safety  Wear your seat belt at all times when driving.  Make a list of emergency phone numbers. The list should include numbers for family, friends, the hospital, and police and fire departments. General instructions  Ask your doctor for a referral to a local prenatal class. Begin classes no later than at the start of month 6 of your pregnancy.  Ask for help if you need counseling or if you need help with nutrition. Your doctor can give you advice or tell you where to go for help.  Do not use hot tubs, steam rooms, or   saunas.  Do not douche or use tampons or scented sanitary pads.  Do not cross your legs for long periods of time.  Avoid all herbs and alcohol. Avoid drugs that are not approved by your doctor.  Do not use any tobacco products, including cigarettes, chewing tobacco, and electronic cigarettes. If you need help quitting, ask your doctor. You may get counseling or other support to help you quit.  Avoid cat litter boxes and soil used by cats. These carry germs that can cause birth defects in the baby and can cause a loss of your baby (miscarriage) or stillbirth.  Visit your dentist. At home, brush  your teeth with a soft toothbrush. Be gentle when you floss. Contact a doctor if:  You are dizzy.  You have mild cramps or pressure in your lower belly.  You have a nagging pain in your belly area.  You continue to feel sick to your stomach, you throw up, or you have watery poop (diarrhea).  You have a bad smelling fluid coming from your vagina.  You have pain when you pee (urinate).  You have increased puffiness (swelling) in your face, hands, legs, or ankles. Get help right away if:  You have a fever.  You are leaking fluid from your vagina.  You have spotting or bleeding from your vagina.  You have very bad belly cramping or pain.  You gain or lose weight rapidly.  You throw up blood. It may look like coffee grounds.  You are around people who have German measles, fifth disease, or chickenpox.  You have a very bad headache.  You have shortness of breath.  You have any kind of trauma, such as from a fall or a car accident. Summary  The first trimester of pregnancy is from week 1 until the end of week 13 (months 1 through 3).  To take care of yourself and your unborn baby, you will need to eat healthy meals, take medicines only if your doctor tells you to do so, and do activities that are safe for you and your baby.  Keep all follow-up visits as told by your doctor. This is important as your doctor will have to ensure that your baby is healthy and growing well. This information is not intended to replace advice given to you by your health care provider. Make sure you discuss any questions you have with your health care provider. Document Released: 08/04/2007 Document Revised: 02/24/2016 Document Reviewed: 02/24/2016 Elsevier Interactive Patient Education  2017 [employer]  

## 2016-09-13 LAB — RH IG WORKUP (INCLUDES ABO/RH)
ABO/RH(D): B NEG
Antibody Screen: NEGATIVE
GESTATIONAL AGE(WKS): 6
Unit division: 0

## 2016-09-13 LAB — GC/CHLAMYDIA PROBE AMP (~~LOC~~) NOT AT ARMC
Chlamydia: NEGATIVE
NEISSERIA GONORRHEA: NEGATIVE

## 2016-09-14 LAB — HIV ANTIBODY (ROUTINE TESTING W REFLEX): HIV Screen 4th Generation wRfx: NONREACTIVE

## 2016-09-14 LAB — RPR: RPR: NONREACTIVE

## 2016-09-15 ENCOUNTER — Ambulatory Visit (HOSPITAL_COMMUNITY)
Admission: RE | Admit: 2016-09-15 | Discharge: 2016-09-15 | Disposition: A | Discharge status: Home / Self Care | Payer: Medicaid Other | Source: Ambulatory Visit | Attending: Certified Nurse Midwife | Admitting: Certified Nurse Midwife

## 2016-09-15 ENCOUNTER — Other Ambulatory Visit: Payer: Self-pay | Admitting: Certified Nurse Midwife

## 2016-09-15 ENCOUNTER — Ambulatory Visit (INDEPENDENT_AMBULATORY_CARE_PROVIDER_SITE_OTHER): Payer: Medicaid Other | Admitting: Certified Nurse Midwife

## 2016-09-15 DIAGNOSIS — O3680X0 Pregnancy with inconclusive fetal viability, not applicable or unspecified: Secondary | ICD-10-CM

## 2016-09-15 DIAGNOSIS — Z3A01 Less than 8 weeks gestation of pregnancy: Secondary | ICD-10-CM | POA: Diagnosis not present

## 2016-09-15 DIAGNOSIS — Z3490 Encounter for supervision of normal pregnancy, unspecified, unspecified trimester: Secondary | ICD-10-CM | POA: Diagnosis present

## 2016-09-15 DIAGNOSIS — Z3491 Encounter for supervision of normal pregnancy, unspecified, first trimester: Secondary | ICD-10-CM

## 2016-09-15 DIAGNOSIS — R1031 Right lower quadrant pain: Secondary | ICD-10-CM | POA: Insufficient documentation

## 2016-09-15 DIAGNOSIS — O26891 Other specified pregnancy related conditions, first trimester: Secondary | ICD-10-CM | POA: Insufficient documentation

## 2016-09-15 DIAGNOSIS — Z3481 Encounter for supervision of other normal pregnancy, first trimester: Secondary | ICD-10-CM

## 2016-09-15 LAB — HCG, QUANTITATIVE, PREGNANCY: hCG, Beta Chain, Quant, S: 15048 m[IU]/mL — ABNORMAL HIGH (ref <5)

## 2016-09-15 NOTE — Progress Notes (Signed)
[name]  is a 36 y.o. [phone] at [redacted]w[redacted]d who presents to the [facility] today for follow-up quant hCG after 48 hrs. The patient was seen in MAU on 09/12/16 and had quant hCG of 8083 and US showed +IUGS, no YS or FP, and right CLC. She endorses worsening RLQ pain since her MAU visit. No vaginal bleeding or fever today.   OB History  Gravida Para Term Preterm AB Living  5 3 3   1 3   SAB TAB Ectopic Multiple Live Births    1     3    # Outcome Date GA Lbr Len/2nd Weight Sex Delivery Anes PTL Lv  5 Current           4 Term 07/08/12 [redacted]w[redacted]d 02:07 / 00:02 7 lb 0.2 oz (3.18 kg) M Vag-Spont None  LIV  3 Term 05/31/06 [redacted]w[redacted]d  7 lb (3.175 kg) F Vag-Spont None  LIV     Birth Comments: No complications  2 Term 06/22/04 [redacted]w[redacted]d  6 lb 7 oz (2.92 kg) F Vag-Spont EPI  LIV     Birth Comments: No complications  1 TAB               Past Medical History:  Diagnosis Date  . Asthma    as child  . Blood type, Rh negative B -  . Depression    ok  . GBS (group B Streptococcus carrier), +RV culture, currently pregnant 06/30/2012   C&S showed Resistant to EES and Clindamycin; sensitive to Vanc  . Genital herpes    rare outbreaks  . Gonorrhea complicating pregnancy in first trimester 12/09/2011   Positive cx on pap done 12/07/11  . Group B streptococcal infection in pregnancy 2006  . 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 Infection    Yeast inf;Not frequent lately  . NSVD (normal spontaneous vaginal delivery) 07/08/2012  . Trichomonas infection   . Type B blood, Rh negative 12/07/2011  . Unsure of LMP (last menstrual period) as reason for ultrasound scan 12/07/2011   EDC by 7week u/s    ROS: No VB + pain  LMP 07/26/2016 (Approximate)   CONSTITUTIONAL: Well-developed, well-nourished female in no acute distress.  MUSCULOSKELETAL: Normal range of motion.  CARDIOVASCULAR: Regular heart rate RESPIRATORY: Normal effort NEUROLOGICAL: Alert and  oriented to person, place, and time.  SKIN: No rash noted. Not diaphoretic. No erythema. No pallor. PSYCH: Normal mood and affect. Normal behavior. Normal judgment and thought content.  Results for orders placed or performed in visit on 09/15/16 (from the past 24 hour(s))  hCG, quantitative, pregnancy     Status: Abnormal   Collection Time: 09/15/16 11:15 AM  Result Value Ref Range   hCG, Beta Chain, Quant, S 15,048 (H) <5 mIU/mL   US Ob Transvaginal  Result Date: 09/15/2016 CLINICAL DATA:  36 year old pregnant female with inconclusive viability presents with 1 day of severe pelvic pain. Quantitative beta HCG 15,048. EDC by LMP: 05/02/2017, projecting to an expected gestational age of [redacted] weeks 2 days. EXAM: TRANSVAGINAL OB ULTRASOUND TECHNIQUE: Transvaginal ultrasound was performed for complete evaluation of the gestation as well as the maternal uterus, adnexal regions, and pelvic cul-de-sac. COMPARISON:  09/12/2016 obstetric scan. FINDINGS: Intrauterine gestational sac: Single intrauterine gestational sac appears normal in size, shape and position. Yolk sac:  Visualized. Embryo:  Visualized. Embryonic Cardiac Activity: Visualized at real-time sonography and on the provided cine sequence. Embryonic Heart Rate:  Unable to obtain M-mode scan due to tiny embryo size and patient discomfort. CRL:   3.1  mm   5 w 6 d                  US EDC: 05/12/2017 Subchorionic hemorrhage:  None visualized. Maternal uterus/adnexae: Anteverted uterus with no uterine fibroids. Right ovary measures 4.2 x 2.5 x 2.1 cm and contains a corpus luteum. Left ovary measures 2.7 x 2.0 x 2.2 cm. IMPRESSION: 1. Single living intrauterine gestation at 5 weeks 6 days by crown-rump length, mildly discordant with the expected gestational age of [redacted] weeks 2 days by provided menstrual dating. Embryonic cardiac activity is visualized at real-time sonography, although the embryonic heart rate could not be obtained due to tiny embryo size and  patient discomfort. Consider a follow-up short-term obstetric scan in 4 weeks to assess for appropriate fetal growth. 2. No acute early first-trimester gestational abnormality. Electronically Signed   By: Delbert PhenixJason A Poff M.D.   On: 09/15/2016 13:55   MDM: Live IUP, rt CLC  A: IUP @[redacted]w[redacted]d  Right CLC  P: Tylenol prn Heating pad on low prn First trimester precautions discussed Patient may return to MAU as needed or if her condition were to change or worsen  Follow up with OB provider of choice in 5 weeks to start care  Donette LarryBhambri, D'Arcy Abraha, CNM 09/15/2016 2:40 PM   Total encounter 10 with >50% spent counseling

## 2016-09-15 NOTE — Progress Notes (Signed)
Patient here for stat bhcg today. Patient reports pelvic pain rated 3/10. Discussed having her wait for results today and updated plan of care. Patient verbalized understanding. Patient reports increased pain. States it is concentrated in her RLQ now and feels more like contractions than normal first pregnancy symptoms. Patient is grabbing her side. Spoke with Donette LarryMelanie Bhambri who recommends patient be worked in for stat ultrasound. Scheduled appt. Patient informed of results & escorted upstairs. Patient will return to office following ultrasound

## 2016-09-21 ENCOUNTER — Ambulatory Visit (HOSPITAL_COMMUNITY): Payer: Medicaid Other

## 2016-11-03 ENCOUNTER — Other Ambulatory Visit: Payer: Self-pay | Admitting: Advanced Practice Midwife

## 2016-11-03 ENCOUNTER — Encounter: Payer: Self-pay | Admitting: Advanced Practice Midwife

## 2016-11-03 ENCOUNTER — Encounter: Payer: Self-pay | Admitting: *Deleted

## 2016-11-03 ENCOUNTER — Ambulatory Visit (INDEPENDENT_AMBULATORY_CARE_PROVIDER_SITE_OTHER): Payer: Medicaid Other | Admitting: Advanced Practice Midwife

## 2016-11-03 ENCOUNTER — Telehealth: Payer: Self-pay | Admitting: General Practice

## 2016-11-03 ENCOUNTER — Other Ambulatory Visit (HOSPITAL_COMMUNITY)
Admission: RE | Admit: 2016-11-03 | Discharge: 2016-11-03 | Disposition: A | Discharge status: Home / Self Care | Payer: Medicaid Other | Source: Ambulatory Visit | Attending: Advanced Practice Midwife | Admitting: Advanced Practice Midwife

## 2016-11-03 VITALS — BP 130/81 | HR 102 | Wt 206.2 lb

## 2016-11-03 DIAGNOSIS — Z368A Encounter for antenatal screening for other genetic defects: Secondary | ICD-10-CM

## 2016-11-03 DIAGNOSIS — Z331 Pregnant state, incidental: Secondary | ICD-10-CM

## 2016-11-03 DIAGNOSIS — Z3A12 12 weeks gestation of pregnancy: Secondary | ICD-10-CM | POA: Diagnosis not present

## 2016-11-03 DIAGNOSIS — Z3480 Encounter for supervision of other normal pregnancy, unspecified trimester: Secondary | ICD-10-CM

## 2016-11-03 DIAGNOSIS — Z6721 Type B blood, Rh negative: Secondary | ICD-10-CM

## 2016-11-03 DIAGNOSIS — Z113 Encounter for screening for infections with a predominantly sexual mode of transmission: Secondary | ICD-10-CM | POA: Diagnosis not present

## 2016-11-03 DIAGNOSIS — Z3481 Encounter for supervision of other normal pregnancy, first trimester: Secondary | ICD-10-CM | POA: Diagnosis not present

## 2016-11-03 DIAGNOSIS — B009 Herpesviral infection, unspecified: Secondary | ICD-10-CM

## 2016-11-03 DIAGNOSIS — Z3482 Encounter for supervision of other normal pregnancy, second trimester: Secondary | ICD-10-CM | POA: Diagnosis present

## 2016-11-03 MED ORDER — CONCEPT OB 130-92.4-1 MG PO CAPS: 1.0000 | ORAL_CAPSULE | Freq: Every day | ORAL | 12 refills | Status: AC

## 2016-11-03 NOTE — Telephone Encounter (Signed)
Patient left the office without checking out.  Called and notified patient of follow up OB visit on 12/01/16 at 11:00am.  Patient voiced understanding.

## 2016-11-03 NOTE — Progress Notes (Signed)
  Subjective:    Tami [name] is being seen today for her first obstetrical visit.  This is a planned pregnancy. She is at 2464w6d gestation. Her obstetrical history is significant for nothing  Hx HSV-2. Relationship with [name]: spouse, living together. Patient does intend to breast feed. Pregnancy history fully reviewed.  Patient reports no complaints.  Review of Systems:   Review of Systems  Constitutional: Negative for chills and fever.  Gastrointestinal: Negative for abdominal pain.  Genitourinary: Negative for dysuria, vaginal bleeding and vaginal discharge.    Objective:     BP 130/81   Pulse (!) 102   Wt 206 lb 3.2 oz (93.5 kg)   LMP 07/26/2016 (Approximate)   BMI 37.71 kg/m  Physical Exam  Nursing note and vitals reviewed. Constitutional: She is oriented to person, place, and time. She appears well-developed and well-nourished. No distress.  Eyes: No scleral icterus.  Neck: No thyromegaly present.  Cardiovascular: Normal rate, regular rhythm and normal heart sounds.   Respiratory: Effort normal and breath sounds normal. No respiratory distress.  GI: Soft. There is no tenderness.  Genitourinary: Vagina normal. No vaginal discharge found.  Musculoskeletal: She exhibits no edema or tenderness.  Neurological: She is alert and oriented to person, place, and time. She has normal reflexes.  Skin: Skin is warm and dry.  Psychiatric: She has a normal mood and affect.    Maternal Exam:  Introitus: Vagina is negative for discharge.    Pos FHR by doppler   Assessment:    Pregnancy: [id number] Patient Active Problem List   Diagnosis Date Noted  . Supervision of other normal pregnancy, antepartum 11/03/2016  . Drug allergy - PCN 03/27/2012  . Gonorrhea of mother during pregnancy 12/09/2011  . Type B blood, Rh negative 12/07/2011  . 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 Supervision of other normal pregnancy, antepartum  - Obstetric Panel, Including HIV - Culture, OB  Urine - Prenat w/o A Vit-FeFum-FePo-FA (CONCEPT OB) 130-92.4-1 MG CAPS; Take 1 tablet by mouth daily.  Dispense: 30 capsule; Refill: 12 - GC/Chlamydia probe amp [facility]  2. history of HSV-2 - Valtrex at 36 weeks  3. Type B blood, Rh negative - Rhophylac 28 weeks  4. Encounter for antenatal screening for other genetic defect  - Genetic Screening-Panorama    Plan:     Initial labs drawn. Prenatal vitamins. Problem list reviewed and updated. AFP3 discussed: Declined Requested NIPS Role of ultrasound in pregnancy discussed; fetal survey: requested. Amniocentesis discussed: not indicated. Follow up in 4 weeks.    Tami KinsmanVirginia Liv [name] 11/03/2016

## 2016-11-03 NOTE — Patient Instructions (Signed)

## 2016-11-03 NOTE — Progress Notes (Signed)
Last pap  06/25/2015

## 2016-11-08 LAB — OBSTETRIC PANEL, INCLUDING HIV
BASOS: 0 %
Basophils Absolute: 0 10*3/uL (ref 0.0–0.2)
EOS (ABSOLUTE): 0.3 10*3/uL (ref 0.0–0.4)
EOS: 3 %
HEMATOCRIT: 40.2 % (ref 34.0–46.6)
HEMOGLOBIN: 13.8 g/dL (ref 11.1–15.9)
HIV Screen 4th Generation wRfx: NONREACTIVE
Hepatitis B Surface Ag: NEGATIVE
IMMATURE GRANS (ABS): 0 10*3/uL (ref 0.0–0.1)
Immature Granulocytes: 0 %
LYMPHS: 16 %
Lymphocytes Absolute: 1.6 10*3/uL (ref 0.7–3.1)
MCH: 30.1 pg (ref 26.6–33.0)
MCHC: 34.3 g/dL (ref 31.5–35.7)
MCV: 88 fL (ref 79–97)
MONOS ABS: 0.8 10*3/uL (ref 0.1–0.9)
Monocytes: 7 %
NEUTROS ABS: 7.9 10*3/uL — AB (ref 1.4–7.0)
Neutrophils: 74 %
Platelets: 226 10*3/uL (ref 150–379)
RBC: 4.59 x10E6/uL (ref 3.77–5.28)
RDW: 14 % (ref 12.3–15.4)
RH TYPE: NEGATIVE
RPR Ser Ql: NONREACTIVE
Rubella Antibodies, IGG: 2.88 index (ref >0.99)
WBC: 10.6 10*3/uL (ref 3.4–10.8)

## 2016-11-08 LAB — AB SCR+ANTIBODY ID: Antibody Screen: POSITIVE — AB

## 2016-11-09 LAB — CERVICOVAGINAL ANCILLARY ONLY
CHLAMYDIA, DNA PROBE: NEGATIVE
NEISSERIA GONORRHEA: NEGATIVE

## 2016-11-09 LAB — URINE CULTURE, OB REFLEX

## 2016-11-09 LAB — CULTURE, OB URINE

## 2016-11-12 ENCOUNTER — Encounter: Payer: Self-pay | Admitting: Advanced Practice Midwife

## 2016-11-12 DIAGNOSIS — O360190 Maternal care for anti-D [Rh] antibodies, unspecified trimester, not applicable or unspecified: Secondary | ICD-10-CM | POA: Insufficient documentation

## 2016-12-01 ENCOUNTER — Ambulatory Visit (INDEPENDENT_AMBULATORY_CARE_PROVIDER_SITE_OTHER): Payer: Medicaid Other | Admitting: Advanced Practice Midwife

## 2016-12-01 VITALS — BP 116/70 | HR 82 | Wt 213.5 lb

## 2016-12-01 DIAGNOSIS — O26899 Other specified pregnancy related conditions, unspecified trimester: Secondary | ICD-10-CM

## 2016-12-01 DIAGNOSIS — O360120 Maternal care for anti-D [Rh] antibodies, second trimester, not applicable or unspecified: Secondary | ICD-10-CM

## 2016-12-01 DIAGNOSIS — O09892 Supervision of other high risk pregnancies, second trimester: Secondary | ICD-10-CM

## 2016-12-01 DIAGNOSIS — Z363 Encounter for antenatal screening for malformations: Secondary | ICD-10-CM

## 2016-12-01 DIAGNOSIS — Z3A18 18 weeks gestation of pregnancy: Secondary | ICD-10-CM

## 2016-12-01 DIAGNOSIS — B009 Herpesviral infection, unspecified: Secondary | ICD-10-CM

## 2016-12-01 DIAGNOSIS — Z6721 Type B blood, Rh negative: Secondary | ICD-10-CM

## 2016-12-01 DIAGNOSIS — Z6791 Unspecified blood type, Rh negative: Secondary | ICD-10-CM

## 2016-12-01 DIAGNOSIS — Z3480 Encounter for supervision of other normal pregnancy, unspecified trimester: Secondary | ICD-10-CM

## 2016-12-01 NOTE — Progress Notes (Signed)
   PRENATAL VISIT NOTE  Subjective:  Tami [name] is a 36 y.o. [id number] at [redacted]w[redacted]d being seen today for ongoing prenatal care.  She is currently monitored for the following issues for this high-risk pregnancy and has Drug allergy - PCN; Type B blood, Rh negative; history of HSV-2; Supervision of other normal pregnancy, antepartum; and Anti-D antibodies present during pregnancy on her problem list.  Patient reports no complaints.  Contractions: Not present. Vag. Bleeding: None.  Movement: Present. Denies leaking of fluid.   The following portions of the patient's history were reviewed and updated as appropriate: allergies, current medications, past family history, past medical history, past social history, past surgical history and problem list. Problem list updated.  Objective:   Vitals:   12/01/16 1209  BP: 116/70  Pulse: 82  Weight: 213 lb 8 oz (96.8 kg)    Fetal Status: Fetal Heart Rate (bpm): 145 Fundal Height: 16 cm Movement: Present     General:  Alert, oriented and cooperative. Patient is in no acute distress.  Skin: Skin is warm and dry. No rash noted.   Cardiovascular: Normal heart rate noted  Respiratory: Normal respiratory effort, no problems with respiration noted  Abdomen: Soft, gravid, appropriate for gestational age.  Pain/Pressure: Present     Pelvic: Cervical exam deferred        Extremities: Normal range of motion.  Edema: None  Mental Status:  Normal mood and affect. Normal behavior. Normal judgment and thought content.   Assessment and Plan:  Pregnancy: G5P3013 at [redacted]w[redacted]d  1. Screening, antenatal, for malformation by ultrasound  - [place] MFM OB DETAIL +14 WK; Future  2. [redacted] weeks gestation of pregnancy  - [place] [facility] DETAIL +14 WK; Future  3. Rh negative state in antepartum period  - [place] [facility] OB DETAIL +14 WK; Future  4. Supervision of other normal pregnancy, antepartum  - [place] M[facility] OB DETAIL +14 WK; Future - AFP TETRA  5. Type B blood, Rh negative  - [place]  MFM OB DETAIL +14 WK; Future  6. history of HSV-2  - [place] [facility] OB DETAIL +14 WK; Future  7. Anti-D antibodies present during pregnancy in second trimester, single or unspecified fetus  - [place] [facility] OB DETAIL +14 WK; Future  Preterm labor symptoms and general obstetric precautions including but not limited to vaginal bleeding, contractions, leaking of fluid and fetal movement were reviewed in detail with the patient. Please refer to After Visit Summary for other counseling recommendations.  Return in about 4 weeks (around 12/29/2016) for ROB.   Dorathy Kinsman, CNM

## 2016-12-01 NOTE — Patient Instructions (Signed)
Breastfeeding Challenges and Solutions  Even though breastfeeding is natural, it can be challenging, especially in the first few weeks after childbirth. It is normal for problems to arise when starting to breastfeed your new baby, even if you have breastfed before. This document provides some solutions to the most common breastfeeding challenges.  Challenges and solutions  Challenge--Cracked or Sore Nipples  Cracked or sore nipples are commonly experienced by breastfeeding mothers. Cracked or sore nipples often are caused by inadequate latching (when your baby's mouth attaches to your breast to breastfeed). Soreness can also happen if your baby is not positioned properly at your breast. Although nipple cracking and soreness are common during the first week after birth, nipple pain is never normal. If you experience nipple cracking or soreness that lasts longer than 1 week or nipple pain, call your health care provider or lactation consultant.  Solution  Ensure proper latching and positioning of your baby by following the steps below:  · Find a comfortable place to sit or lie down, with your neck and back well supported.  · Place a pillow or rolled up blanket under your baby to bring him or her to the level of your breast (if you are seated).  · Make sure that your baby's abdomen is facing your abdomen.  · Gently massage your breast. With your fingertips, massage from your chest wall toward your nipple in a circular motion. This encourages milk flow. You may need to continue this action during the feeding if your milk flows slowly.  · Support your breast with 4 fingers underneath and your thumb above your nipple. Make sure your fingers are well away from your nipple and your baby’s mouth.  · Stroke your baby's lips gently with your finger or nipple.  · When your baby's mouth is open wide enough, quickly bring your baby to your breast, placing your entire nipple and as much of the colored area around your nipple  (areola) as possible into your baby's mouth.  ? More areola should be visible above your baby's upper lip than below the lower lip.  ? Your baby's tongue should be between his or her lower gum and your breast.  · Ensure that your baby's mouth is correctly positioned around your nipple (latched). Your baby's lips should create a seal on your breast and be turned out (everted).  · It is common for your baby to suck for about 2-3 minutes in order to start the flow of breast milk.    Signs that your baby has successfully latched on to your nipple include:  · Quietly tugging or quietly sucking without causing you pain.  · Swallowing heard between every 3-4 sucks.  · Muscle movement above and in front of his or her ears with sucking.    Signs that your baby has not successfully latched on to nipple include:  · Sucking sounds or smacking sounds from your baby while nursing.  · Nipple pain.    Ensure that your breasts stay moisturized and healthy by:  · Avoiding the use of soap on your nipples.  · Wearing a supportive bra. Avoid wearing underwire-style bras or tight bras.  · Air drying your nipples for 3-4 minutes after each feeding.  · Using only cotton bra pads to absorb breast milk leakage. Leaking of breast milk between feedings is normal. Be sure to change the pads if they become soaked with milk.  · Using lanolin on your nipples after nursing. Lanolin helps to maintain your   skin's normal moisture barrier. If you use pure lanolin you do not need to wash it off before feeding your baby again. Pure lanolin is not toxic to your baby. You may also hand express a few drops of breast milk and gently massage that milk into your nipples, allowing it to air dry.    Challenge--Breast Engorgement  Breast engorgement is the overfilling of your breasts with breast milk. In the first few weeks after giving birth, you may experience breast engorgement. Breast engorgement can make your breasts throb and feel hard, tightly stretched,  warm, and tender. Engorgement peaks about the fifth day after you give birth. Having breast engorgement does not mean you have to stop breastfeeding your baby.  Solution  · Breastfeed when you feel the need to reduce the fullness of your breasts or when your baby shows signs of hunger. This is called "breastfeeding on demand."  · Newborns (babies younger than 4 weeks) often breastfeed every 1-3 hours during the day. You may need to awaken your baby to feed if he or she is asleep at a feeding time.  · Do not allow your baby to sleep longer than 5 hours during the night without a feeding.  · Pump or hand express breast milk before breastfeeding to soften your breast, areola, and nipple.  · Apply warm, moist heat (in the shower or with warm water-soaked hand towels) just before feeding or pumping, or massage your breast before or during breastfeeding. This increases circulation and helps your milk to flow.  · Completely empty your breasts when breastfeeding or pumping. Afterward, wear a snug bra (nursing or regular) or tank top for 1-2 days to signal your body to slightly decrease milk production. Only wear snug bras or tank tops to treat engorgement. Tight bras typically should be avoided by breastfeeding mothers. Once engorgement is relieved, return to wearing regular, loose-fitting clothes.  · Apply ice packs to your breasts to lessen the pain from engorgement and relieve swelling, unless the ice is uncomfortable for you.  · Do not delay feedings. Try to relax when it is time to feed your baby. This helps to trigger your "let-down reflex," which releases milk from your breast.  · Ensure your baby is latched on to your breast and positioned properly while breastfeeding.  · Allow your baby to remain at your breast as long as he or she is latched on well and actively sucking. Your baby will let you know when he or she is done breastfeeding by pulling away from your breast or falling asleep.  · Avoid introducing bottles  or pacifiers to your baby in the early weeks of breastfeeding. Wait to introduce these things until after resolving any breastfeeding challenges.  · Try to pump your milk on the same schedule as when your baby would breastfeed if you are returning to work or away from home for an extended period.  · Drink plenty of fluids to avoid dehydration, which can eventually put you at greater risk of breast engorgement.    If you follow these suggestions, your engorgement should improve in 24-48 hours. If you are still experiencing difficulty, call your lactation consultant or health care provider.  Challenge--Plugged Milk Ducts  Plugged milk ducts occur when the duct does not drain milk effectively and becomes swollen. Wearing a tight-fitting nursing bra or having difficulty with latching may cause plugged milk ducts. Not drinking enough water (8-10 c [1.9-2.4 L] per day) can contribute to plugged milk ducts. Once a   duct has become plugged, hard lumps, soreness, and redness may develop in your breast.  Solution  Do not delay feedings. Feed your baby frequently and try to empty your breasts of milk at each feeding. Try breastfeeding from the affected side first so there is a better chance that the milk will drain completely from that breast. Apply warm, moist towels to your breasts for 5-10 minutes before feeding. Alternatively, a hot shower right before breastfeeding can provide the moist heat that can encourage milk flow. Gentle massage of the sore area before and during a feeding may also help. Avoid wearing tight clothing or bras that put pressure on your breasts. Wear bras that offer good support to your breasts, but avoid underwire bras. If you have a plugged milk duct and develop a fever, you need to see your health care provider.  Challenge--Mastitis  Mastitis is inflammation of your breast. It usually is caused by a bacterial infection and can cause flu-like symptoms. You may develop redness in your breast and a  fever. Often when mastitis occurs, your breast becomes firm, warm, and very painful. The most common causes of mastitis are poor latching, ineffective sucking from your baby, consistent pressure on your breast (possibly from wearing a tight-fitting bra or shirt that restricts the milk flow), unusual stress or fatigue, or missed feedings.  Solution  You will be given antibiotic medicine to treat the infection. It is still important to breastfeed frequently to empty your breasts. Continuing to breastfeed while you recover from mastitis will not harm your baby. Make sure your baby is positioned properly during every feeding. Apply moist heat to your breasts for a few minutes before feeding to help the milk flow and to help your breasts empty more easily.  Challenge--Thrush  Thrush is a yeast infection that can form on your nipples, in your breast, or in your baby's mouth. It causes itching, soreness, burning or stabbing pain, and sometimes a rash.  Solution  You will be given a medicated ointment for your nipples, and your baby will be given a liquid medicine for his or her mouth. It is important that you and your baby are treated at the same time because thrush can be passed between you and your baby. Change disposable nursing pads often. Any bras, towels, or clothing that come in contact with infected areas of your body or your baby's body need to be washed in very hot water every day. Wash your hands and your baby's hands often. All pacifiers, bottle nipples, or toys your baby puts in his or her mouth should be boiled once a day for 20 minutes. After 1 week of treatment, discard pacifiers and bottle nipples and buy new ones. All breast pump parts that touch the milk need to be boiled for 20 minutes every day.  Challenge--Low Milk Supply  You may not be producing enough milk if your baby is not gaining the proper amount of weight. Breast milk production is based on a supply-and-demand system. Your milk supply depends  on how frequently and effectively your baby empties your breast.  Solution  The more you breastfeed and pump, the more breast milk you will produce. It is important that your baby empties at least one of your breasts at each feeding. If this is not happening, then use a breast pump or hand express any milk that remains. This will help to drain as much milk as possible at each feeding. It will also signal your body to produce more   milk. If your baby is not emptying your breasts, it may be due to latching, sucking, or positioning problems. If low milk supply continues after addressing these issues, contact your health care provider or a lactation specialist as soon as possible.  Challenge--Inverted or Flat Nipples  Some women have nipples that turn inward instead of protruding outward. Other women have nipples that are flat. Inverted or flat nipples can sometimes make it more difficult for your baby to latch onto your breast.  Solution  You may be given a small device that pulls out inverted nipples. This device should be applied right before your baby is brought to your breast. You can also try using a breast pump for a short time before placing the baby at your breast. The pump can pull your nipple outwards to help your infant latch more easily. The baby's sucking motion will help the inverted nipple protrude as well.  If you have flat nipples, encourage your baby to latch onto your breast and feed frequently in the early days after birth. This will give your baby practice latching on correctly while your breast is still soft. When your milk supply increases, between the second and fifth day after birth and your breasts become full, your baby will have an easier time latching.  Contact a lactation consultant if you still have concerns. She or he can teach you additional techniques to address breastfeeding problems related to nipple shape and position.  Where to find more information:  [employer]:  www.llli.org  This information is not intended to replace advice given to you by your health care provider. Make sure you discuss any questions you have with your health care provider.  Document Released: 08/09/2005 Document Revised: 07/30/2015 Document Reviewed: 08/11/2012  Elsevier Interactive Patient Education © 2017 [employer] Inc[employer]

## 2016-12-03 LAB — AFP TETRA
DIA Mom Value: 0.91
DIA Value (EIA): 123.75 pg/mL
DSR (BY AGE) 1 IN: 273
DSR (Second Trimester) 1 IN: 675
Gestational Age: 16.6 WEEKS
MATERNAL AGE AT EDD: 35.4 a
MSAFP MOM: 0.49
MSAFP: 15.5 ng/mL
MSHCG MOM: 1.07
MSHCG: 28829 m[IU]/mL
OSB RISK: 10000
T18 (By Age): 1:1064 {titer}
TEST RESULTS AFP: NEGATIVE
UE3 VALUE: 1.11 ng/mL
Weight: 213 [lb_av]
uE3 Mom: 1.36

## 2016-12-05 ENCOUNTER — Encounter: Payer: Self-pay | Admitting: Advanced Practice Midwife

## 2016-12-09 ENCOUNTER — Other Ambulatory Visit: Payer: Self-pay | Admitting: Advanced Practice Midwife

## 2016-12-09 ENCOUNTER — Ambulatory Visit (HOSPITAL_COMMUNITY)
Admission: RE | Admit: 2016-12-09 | Discharge: 2016-12-09 | Disposition: A | Discharge status: Home / Self Care | Payer: Medicaid Other | Source: Ambulatory Visit | Attending: Advanced Practice Midwife | Admitting: Advanced Practice Midwife

## 2016-12-09 DIAGNOSIS — O09522 Supervision of elderly multigravida, second trimester: Secondary | ICD-10-CM | POA: Diagnosis not present

## 2016-12-09 DIAGNOSIS — O99212 Obesity complicating pregnancy, second trimester: Secondary | ICD-10-CM

## 2016-12-09 DIAGNOSIS — Z363 Encounter for antenatal screening for malformations: Secondary | ICD-10-CM

## 2016-12-09 DIAGNOSIS — B009 Herpesviral infection, unspecified: Secondary | ICD-10-CM

## 2016-12-09 DIAGNOSIS — O26899 Other specified pregnancy related conditions, unspecified trimester: Secondary | ICD-10-CM

## 2016-12-09 DIAGNOSIS — Z6791 Unspecified blood type, Rh negative: Secondary | ICD-10-CM

## 2016-12-09 DIAGNOSIS — Z6833 Body mass index (BMI) 33.0-33.9, adult: Secondary | ICD-10-CM | POA: Insufficient documentation

## 2016-12-09 DIAGNOSIS — Z3A18 18 weeks gestation of pregnancy: Secondary | ICD-10-CM

## 2016-12-09 DIAGNOSIS — E669 Obesity, unspecified: Secondary | ICD-10-CM | POA: Diagnosis not present

## 2016-12-09 DIAGNOSIS — Z3689 Encounter for other specified antenatal screening: Secondary | ICD-10-CM

## 2016-12-09 DIAGNOSIS — Z6721 Type B blood, Rh negative: Secondary | ICD-10-CM

## 2016-12-09 DIAGNOSIS — O09892 Supervision of other high risk pregnancies, second trimester: Secondary | ICD-10-CM | POA: Diagnosis not present

## 2016-12-09 DIAGNOSIS — O360120 Maternal care for anti-D [Rh] antibodies, second trimester, not applicable or unspecified: Secondary | ICD-10-CM

## 2016-12-09 DIAGNOSIS — Z3480 Encounter for supervision of other normal pregnancy, unspecified trimester: Secondary | ICD-10-CM

## 2016-12-28 ENCOUNTER — Ambulatory Visit (INDEPENDENT_AMBULATORY_CARE_PROVIDER_SITE_OTHER): Payer: Medicaid Other | Admitting: Obstetrics and Gynecology

## 2016-12-28 ENCOUNTER — Ambulatory Visit (INDEPENDENT_AMBULATORY_CARE_PROVIDER_SITE_OTHER): Payer: Medicaid Other | Admitting: Clinical

## 2016-12-28 ENCOUNTER — Encounter: Payer: Self-pay | Admitting: Family Medicine

## 2016-12-28 VITALS — BP 113/70 | HR 89

## 2016-12-28 DIAGNOSIS — O26899 Other specified pregnancy related conditions, unspecified trimester: Secondary | ICD-10-CM

## 2016-12-28 DIAGNOSIS — F4322 Adjustment disorder with anxiety: Secondary | ICD-10-CM | POA: Diagnosis not present

## 2016-12-28 DIAGNOSIS — R109 Unspecified abdominal pain: Secondary | ICD-10-CM

## 2016-12-28 DIAGNOSIS — R102 Pelvic and perineal pain: Secondary | ICD-10-CM

## 2016-12-28 DIAGNOSIS — Z3482 Encounter for supervision of other normal pregnancy, second trimester: Secondary | ICD-10-CM

## 2016-12-28 DIAGNOSIS — Z3480 Encounter for supervision of other normal pregnancy, unspecified trimester: Secondary | ICD-10-CM

## 2016-12-28 DIAGNOSIS — O26892 Other specified pregnancy related conditions, second trimester: Secondary | ICD-10-CM | POA: Insufficient documentation

## 2016-12-28 LAB — POCT URINALYSIS DIP (DEVICE)
Bilirubin Urine: NEGATIVE
GLUCOSE, UA: NEGATIVE mg/dL
HGB URINE DIPSTICK: NEGATIVE
Ketones, ur: NEGATIVE mg/dL
LEUKOCYTES UA: NEGATIVE
Nitrite: NEGATIVE
PH: 7 (ref 5.0–8.0)
PROTEIN: NEGATIVE mg/dL
Specific Gravity, Urine: 1.025 (ref 1.005–1.030)
Urobilinogen, UA: 0.2 mg/dL (ref 0.0–1.0)

## 2016-12-28 NOTE — BH Specialist Note (Signed)
Integrated Behavioral Health Initial Visit  MRN: [id number] Name: Tami [name]  Number of Integrated Behavioral Health Clinician visits:: 1/6 Session Start time: 4:20  Session End time: 4:50 Total time: 30 minutes  Type of Service: Integrated Behavioral Health- Individual/Family Interpretor:No. Interpretor Name and Language: n/a   Warm Hand Off Completed.       SUBJECTIVE: Tami [name] is a 36 y.o. female accompanied by n/a Patient was referred by Venia CarbonJennifer Rasch, NP  for stress Patient reports the following symptoms/concerns: Pt states her primary concern today is feeling pain in her abdomen when she is under emotional stress; is open to learning coping strategy for daily stress management.  Duration of problem: Over one month; Severity of problem: mild  OBJECTIVE: Mood: Hopeless and Affect: Appropriate Risk of harm to self or others: No plan to harm self or others  LIFE CONTEXT: Family and Social: Lives with her 3 children; divorced [name]. Family and friends are supportive, including church support School/Work: Graduated from [employer] in May, working fulltime Self-Care: Used to use sage, inspiring messages(Soul Sunday) and attend church; recognizing a greater need for self-care Life Changes: Current pregnancy  GOALS ADDRESSED: Patient will: 1. Reduce symptoms of: stress 2. Increase knowledge and/or ability of: stress reduction  3. 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   Patient may benefit from psychoeducation and brief therapeutic interventions regarding coping with symptoms of anxiety.  PLAN: 1. Follow up with behavioral health clinician on : One  month 2. Behavioral recommendations:  -Consider attending church services, listening to inspirational messages, and using sage again, on a weekly basis -CALM relaxation breathing exercise every morning -Consider apps for additional self-coping strategy -Read educational material regarding coping with symptoms of anxiety(and depression, as prevention) 3. Referral(s): Integrated Behavioral Health Services (In Clinic) 4. "From scale of 1-10, how likely are you to follow plan?": 10  Rae LipsJamie C Zedric Deroy, LCSWA   Depression screen [facility] 2/9 12/28/2016 12/01/2016 11/03/2016  Decreased Interest 1 1 3   Down, Depressed, Hopeless 2 0 0  PHQ - 2 Score 3 1 3   Altered sleeping 1 1 3   Tired, decreased energy 0 0 3  Change in appetite 0 - 1  Feeling bad or failure about yourself  2 0 0  Trouble concentrating 0 0 0  Moving slowly or fidgety/restless 0 0 1  Suicidal thoughts 1 0 0  PHQ-9 Score 7 2 11    GAD 7 : Generalized Anxiety Score 12/28/2016 12/02/2016 11/03/2016  Nervous, Anxious, on Edge 0 0 0  Control/stop worrying 2 0 1  Worry too much - different things 2 0 1  Trouble relaxing 2 0 0  Restless 1 0 0  Easily annoyed or irritable 1 1 1   Afraid - awful might happen 1 0 0  Total GAD 7 Score 9 1 3

## 2016-12-28 NOTE — Progress Notes (Signed)
    Subjective:  Ms. Donnetta SimpersBrittnie L Wooley is a 36 y.o. female @ [phone] @ 7762w5d here in the office today with complaints of abdominal pain. The pain started last night around 11 pm and remained for most of the night. She currently reports that the pain has gone away. Nothing makes the pain worse; states she feels the pain when she is sitting or standing. No history of preterm birth/ no vaginal bleeding. States she feels that the pain comes when she is feeling under stress.   Patient reports abdominal pain; none currently . Denies leaking of fluid or vaginal bleeding.   The following portions of the patient's history were reviewed and updated as appropriate: allergies, current medications, past family history, past medical history, past social history, past surgical history and problem list. Problem list updated.  Objective:   There were no vitals filed for this visit.   General:  Alert, oriented and cooperative. Patient is in no acute distress.  Skin: Skin is warm and dry. No rash noted.   Cardiovascular: Normal heart rate noted  Respiratory: Normal respiratory effort, no problems with respiration noted  Abdomen: Soft, gravid, appropriate for gestational age. No rebound   Pelvic:  Cervical exam deferred: patient refused   Extremities: Normal range of motion.    Mental Status: Normal mood and affect. Normal behavior. Normal judgment and thought content.   Urinalysis: negative   Assessment and Plan:  Pregnancy: @[redacted]w[redacted]d   + fetal heart tones UA today negative Patient without pain at this time.  Likely secondary to round ligament pain. Recommend pregnancy support belt   Preterm labor symptoms and general obstetric precautions including but not limited to vaginal bleeding, contractions, leaking of fluid and fetal movement were reviewed in detail with the patient. Please refer to After Visit Summary for other counseling recommendations.   Follow up here in the office or go to [facility] if  symptoms worsen  Rasch, Harolyn RutherfordJennifer I, NP

## 2016-12-30 ENCOUNTER — Encounter: Payer: Medicaid Other | Admitting: Student

## 2017-01-19 ENCOUNTER — Ambulatory Visit (INDEPENDENT_AMBULATORY_CARE_PROVIDER_SITE_OTHER): Payer: Medicaid Other

## 2017-01-19 VITALS — BP 109/70 | HR 92 | Wt 224.9 lb

## 2017-01-19 DIAGNOSIS — Z3482 Encounter for supervision of other normal pregnancy, second trimester: Secondary | ICD-10-CM

## 2017-01-19 DIAGNOSIS — Z3480 Encounter for supervision of other normal pregnancy, unspecified trimester: Secondary | ICD-10-CM

## 2017-01-19 NOTE — Progress Notes (Signed)
   PRENATAL VISIT NOTE  Subjective:  Tami [name] is a 36 y.o. [id number] at 3515w6d being seen today for ongoing prenatal care.  She is currently monitored for the following issues for this low-risk pregnancy and has Drug allergy - PCN; Type B blood, Rh negative; history of HSV-2; Supervision of other normal pregnancy, antepartum; Anti-D antibodies present during pregnancy; Pain of round ligament affecting pregnancy, antepartum; and Abdominal pain in pregnancy, second trimester on their problem list.  Patient reports no complaints.  Contractions: Not present. Vag. Bleeding: None.  Movement: Present. Denies leaking of fluid.   The following portions of the patient's history were reviewed and updated as appropriate: allergies, current medications, past family history, past medical history, past social history, past surgical history and problem list. Problem list updated.  Objective:   Vitals:   01/19/17 1610  BP: 109/70  Pulse: 92  Weight: 224 lb 14.4 oz (102 kg)    Fetal Status: Fetal Heart Rate (bpm): 151   Movement: Present     General:  Alert, oriented and cooperative. Patient is in no acute distress.  Skin: Skin is warm and dry. No rash noted.   Cardiovascular: Normal heart rate noted  Respiratory: Normal respiratory effort, no problems with respiration noted  Abdomen: Soft, gravid, appropriate for gestational age.  Pain/Pressure: Present     Pelvic: Cervical exam deferred        Extremities: Normal range of motion.  Edema: None  Mental Status:  Normal mood and affect. Normal behavior. Normal judgment and thought content.   Assessment and Plan:  Pregnancy: [id number] at 3515w6d  1. Supervision of other normal pregnancy, antepartum -Encouraged patient to continue using maternity support belt and increase fluids. -Discussed 2hr GTT and labs for next visit  Preterm labor symptoms and general obstetric precautions including but not limited to vaginal bleeding, contractions, leaking  of fluid and fetal movement were reviewed in detail with the patient. Please refer to After Visit Summary for other counseling recommendations.  Return in about 3 weeks (around 02/09/2017), or ROB with 2hr gtt and labs.  Rolm BookbinderCaroline M Evangelina Delancey, CNM 01/19/17 4:30 PM

## 2017-01-19 NOTE — Progress Notes (Signed)
109/70 

## 2017-01-19 NOTE — Patient Instructions (Signed)
Glucose Tolerance Test During Pregnancy The glucose tolerance test (GTT) is a blood test used to determine if you have developed a type of diabetes during pregnancy (gestational diabetes). This is when your body does not properly process sugar (glucose) in the food you eat, resulting in high blood glucose levels. Typically, a GTT is done after you have had a 1-hour glucose test with results that indicate you possibly have gestational diabetes. It may also be done if:  You have a history of giving birth to very large babies or have experienced repeated fetal loss (stillbirth).  You have signs and symptoms of diabetes, such as: ? Changes in your vision. ? Tingling or numbness in your hands or feet. ? Changes in hunger, thirst, and urination not otherwise explained by your pregnancy.  The GTT lasts about 3 hours. You will be given a sugar-water solution to drink at the beginning of the test. You will have blood drawn before you drink the solution and then again 1, 2, and 3 hours after you drink it. You will not be allowed to eat or drink anything else during the test. You must remain at the testing location to make sure that your blood is drawn on time. You should also avoid exercising during the test, because exercise can alter test results. How do I prepare for this test? Eat normally for 3 days prior to the GTT test, including having plenty of carbohydrate-rich foods. Do not eat or drink anything except water during the final 12 hours before the test. In addition, your health care provider may ask you to stop taking certain medicines before the test. What do the results mean? It is your responsibility to obtain your test results. Ask the lab or department performing the test when and how you will get your results. Contact your health care provider to discuss any questions you have about your results. Range of Normal Values Ranges for normal values may vary among different labs and hospitals. You  should always check with your health care provider after having lab work or other tests done to discuss whether your values are considered within normal limits. Normal levels of blood glucose are as follows:  Fasting: less than 105 mg/dL.  1 hour after drinking the solution: less than 190 mg/dL.  2 hours after drinking the solution: less than 165 mg/dL.  3 hours after drinking the solution: less than 145 mg/dL.  Some substances can interfere with GTT results. These may include:  Blood pressure and heart failure medicines, including beta blockers, furosemide, and thiazides.  Anti-inflammatory medicines, including aspirin.  Nicotine.  Some psychiatric medicines.  Meaning of Results Outside Normal Value Ranges GTT test results that are above normal values may indicate a number of health problems, such as:  Gestational diabetes.  Acute stress response.  Cushing syndrome.  Tumors such as pheochromocytoma or glucagonoma.  Long-term kidney problems.  Pancreatitis.  Hyperthyroidism.  Current infection.  Discuss your test results with your health care provider. He or she will use the results to make a diagnosis and determine a treatment plan that is right for you. This information is not intended to replace advice given to you by your health care provider. Make sure you discuss any questions you have with your health care provider. Document Released: 08/17/2011 Document Revised: 07/24/2015 Document Reviewed: 06/22/2013 Elsevier Interactive Patient Education  2017 Else[employer] Fetal Movement Counts Patient Name: ________________________________________________ Patient Due Date: ____________________ What is a fetal movement count? A fetal movement count is  the number of times that you feel your baby move during a certain amount of time. This may also be called a fetal kick count. A fetal movement count is recommended for every pregnant woman. You may be asked to start counting  fetal movements as early as week 28 of your pregnancy. Pay attention to when your baby is most active. You may notice your baby's sleep and wake cycles. You may also notice things that make your baby move more. You should do a fetal movement count:  When your baby is normally most active.  At the same time each day.  A good time to count movements is while you are resting, after having something to eat and drink. How do I count fetal movements? 1. Find a quiet, comfortable area. Sit, or lie down on your side. 2. Write down the date, the start time and stop time, and the number of movements that you felt between those two times. Take this information with you to your health care visits. 3. For 2 hours, count kicks, flutters, swishes, rolls, and jabs. You should feel at least 10 movements during 2 hours. 4. You may stop counting after you have felt 10 movements. 5. If you do not feel 10 movements in 2 hours, have something to eat and drink. Then, keep resting and counting for 1 hour. If you feel at least 4 movements during that hour, you may stop counting. Contact a health care provider if:  You feel fewer than 4 movements in 2 hours.  Your baby is not moving like he or she usually does. Date: ____________ Start time: ____________ Stop time: ____________ Movements: ____________ Date: ____________ Start time: ____________ Stop time: ____________ Movements: ____________ Date: ____________ Start time: ____________ Stop time: ____________ Movements: ____________ Date: ____________ Start time: ____________ Stop time: ____________ Movements: ____________ Date: ____________ Start time: ____________ Stop time: ____________ Movements: ____________ Date: ____________ Start time: ____________ Stop time: ____________ Movements: ____________ Date: ____________ Start time: ____________ Stop time: ____________ Movements: ____________ Date: ____________ Start time: ____________ Stop time: ____________  Movements: ____________ Date: ____________ Start time: ____________ Stop time: ____________ Movements: ____________ This information is not intended to replace advice given to you by your health care provider. Make sure you discuss any questions you have with your health care provider. Document Released: 03/17/2006 Document Revised: 10/15/2015 Document Reviewed: 03/27/2015 Elsevier Interactive Patient Education  [employer]2018 [employer] Inc[employer]

## 2017-02-09 ENCOUNTER — Ambulatory Visit (INDEPENDENT_AMBULATORY_CARE_PROVIDER_SITE_OTHER): Payer: Medicaid Other | Admitting: Medical

## 2017-02-09 ENCOUNTER — Encounter: Payer: Self-pay | Admitting: General Practice

## 2017-02-09 VITALS — BP 118/72 | HR 92 | Wt 219.5 lb

## 2017-02-09 DIAGNOSIS — O360120 Maternal care for anti-D [Rh] antibodies, second trimester, not applicable or unspecified: Secondary | ICD-10-CM

## 2017-02-09 DIAGNOSIS — Z3482 Encounter for supervision of other normal pregnancy, second trimester: Secondary | ICD-10-CM

## 2017-02-09 DIAGNOSIS — N949 Unspecified condition associated with female genital organs and menstrual cycle: Secondary | ICD-10-CM | POA: Diagnosis not present

## 2017-02-09 DIAGNOSIS — Z6721 Type B blood, Rh negative: Secondary | ICD-10-CM | POA: Diagnosis not present

## 2017-02-09 DIAGNOSIS — Z23 Encounter for immunization: Secondary | ICD-10-CM

## 2017-02-09 DIAGNOSIS — Z3480 Encounter for supervision of other normal pregnancy, unspecified trimester: Secondary | ICD-10-CM

## 2017-02-09 MED ORDER — RHO D IMMUNE GLOBULIN 1500 UNIT/2ML IJ SOSY
300.0000 ug | PREFILLED_SYRINGE | Freq: Once | INTRAMUSCULAR | Status: AC
  Administered 2017-02-09: 300 ug via INTRAMUSCULAR

## 2017-02-09 NOTE — Patient Instructions (Signed)

## 2017-02-09 NOTE — Progress Notes (Signed)
   PRENATAL VISIT NOTE  Subjective:  Tami [name] is a 36 y.o. [id number] at 7125w6d being seen today for ongoing prenatal care.  She is currently monitored for the following issues for this high-risk pregnancy and has Drug allergy - PCN; Type B blood, Rh negative; history of HSV-2; Supervision of other normal pregnancy, antepartum; Anti-D antibodies present during pregnancy; Pain of round ligament affecting pregnancy, antepartum; and Abdominal pain in pregnancy, second trimester on their problem list.  Patient reports intermittent lower abdominal pain.  Contractions: Not present. Vag. Bleeding: None.  Movement: Present. Denies leaking of fluid.   The following portions of the patient's history were reviewed and updated as appropriate: allergies, current medications, past family history, past medical history, past social history, past surgical history and problem list. Problem list updated.  Objective:   Vitals:   02/09/17 0903  BP: 118/72  Pulse: 92  Weight: 219 lb 8 oz (99.6 kg)    Fetal Status: Fetal Heart Rate (bpm): 150 Fundal Height: 29 cm Movement: Present     General:  Alert, oriented and cooperative. Patient is in no acute distress.  Skin: Skin is warm and dry. No rash noted.   Cardiovascular: Normal heart rate noted  Respiratory: Normal respiratory effort, no problems with respiration noted  Abdomen: Soft, gravid, appropriate for gestational age.  Pain/Pressure: Present     Pelvic: Cervical exam deferred        Extremities: Normal range of motion.  Edema: None  Mental Status:  Normal mood and affect. Normal behavior. Normal judgment and thought content.   Assessment and Plan:  Pregnancy: [id number] at 2525w6d  1. Supervision of other normal pregnancy, antepartum - CBC - RPR - HIV antibody (with reflex) - Glucose Tolerance, 2 Hours w/1 Hour - Tdap vaccine greater than or equal to 7yo IM  2. Anti-D antibodies present during pregnancy in second trimester, single or  unspecified fetus - Antibody screen  3. Type B blood, Rh negative - rho (d) immune globulin (RHIG/RHOPHYLAC) injection 300 mcg  4. Round ligament pain - Advised use of abdominal binder   Preterm labor symptoms and general obstetric precautions including but not limited to vaginal bleeding, contractions, leaking of fluid and fetal movement were reviewed in detail with the patient. Please refer to After Visit Summary for other counseling recommendations.  Return in about 2 weeks (around 02/23/2017) for LOB.   Vonzella NippleJulie Josealfredo Adkins, PA-C

## 2017-02-10 ENCOUNTER — Other Ambulatory Visit: Payer: Self-pay | Admitting: Medical

## 2017-02-10 DIAGNOSIS — Z3480 Encounter for supervision of other normal pregnancy, unspecified trimester: Secondary | ICD-10-CM

## 2017-02-10 LAB — CBC
HEMOGLOBIN: 13 g/dL (ref 11.1–15.9)
Hematocrit: 38.4 % (ref 34.0–46.6)
MCH: 29.9 pg (ref 26.6–33.0)
MCHC: 33.9 g/dL (ref 31.5–35.7)
MCV: 88 fL (ref 79–97)
PLATELETS: 233 10*3/uL (ref 150–379)
RBC: 4.35 x10E6/uL (ref 3.77–5.28)
RDW: 14.2 % (ref 12.3–15.4)
WBC: 10.2 10*3/uL (ref 3.4–10.8)

## 2017-02-10 LAB — GLUCOSE TOLERANCE, 2 HOURS W/ 1HR
GLUCOSE, 1 HOUR: 121 mg/dL (ref 65–179)
GLUCOSE, 2 HOUR: 115 mg/dL (ref 65–152)
Glucose, Fasting: 84 mg/dL (ref 65–91)

## 2017-02-10 LAB — RPR: RPR Ser Ql: NONREACTIVE

## 2017-02-10 LAB — HIV ANTIBODY (ROUTINE TESTING W REFLEX): HIV Screen 4th Generation wRfx: NONREACTIVE

## 2017-02-23 ENCOUNTER — Encounter: Payer: Self-pay | Admitting: General Practice

## 2017-02-23 ENCOUNTER — Ambulatory Visit (INDEPENDENT_AMBULATORY_CARE_PROVIDER_SITE_OTHER): Payer: Medicaid Other

## 2017-02-23 DIAGNOSIS — Z3480 Encounter for supervision of other normal pregnancy, unspecified trimester: Secondary | ICD-10-CM

## 2017-02-23 DIAGNOSIS — Z3482 Encounter for supervision of other normal pregnancy, second trimester: Secondary | ICD-10-CM

## 2017-02-23 NOTE — Patient Instructions (Signed)
Braxton Hicks Contractions °Contractions of the uterus can occur throughout pregnancy, but they are not always a sign that you are in labor. You may have practice contractions called Braxton Hicks contractions. These false labor contractions are sometimes confused with true labor. °What are Braxton Hicks contractions? °Braxton Hicks contractions are tightening movements that occur in the muscles of the uterus before labor. Unlike true labor contractions, these contractions do not result in opening (dilation) and thinning of the cervix. Toward the end of pregnancy (32-34 weeks), Braxton Hicks contractions can happen more often and may become stronger. These contractions are sometimes difficult to tell apart from true labor because they can be very uncomfortable. You should not feel embarrassed if you go to the hospital with false labor. °Sometimes, the only way to tell if you are in true labor is for your health care provider to look for changes in the cervix. The health care provider will do a physical exam and may monitor your contractions. If you are not in true labor, the exam should show that your cervix is not dilating and your water has not broken. °If there are other health problems associated with your pregnancy, it is completely safe for you to be sent home with false labor. You may continue to have Braxton Hicks contractions until you go into true labor. °How to tell the difference between true labor and false labor °True labor °· Contractions last 30-70 seconds. °· Contractions become very regular. °· Discomfort is usually felt in the top of the uterus, and it spreads to the lower abdomen and low back. °· Contractions do not go away with walking. °· Contractions usually become more intense and increase in frequency. °· The cervix dilates and gets thinner. °False labor °· Contractions are usually shorter and not as strong as true labor contractions. °· Contractions are usually irregular. °· Contractions  are often felt in the front of the lower abdomen and in the groin. °· Contractions may go away when you walk around or change positions while lying down. °· Contractions get weaker and are shorter-lasting as time goes on. °· The cervix usually does not dilate or become thin. °Follow these instructions at home: °· Take over-the-counter and prescription medicines only as told by your health care provider. °· Keep up with your usual exercises and follow other instructions from your health care provider. °· Eat and drink lightly if you think you are going into labor. °· If Braxton Hicks contractions are making you uncomfortable: °? Change your position from lying down or resting to walking, or change from walking to resting. °? Sit and rest in a tub of warm water. °? Drink enough fluid to keep your urine pale yellow. Dehydration may cause these contractions. °? Do slow and deep breathing several times an hour. °· Keep all follow-up prenatal visits as told by your health care provider. This is important. °Contact a health care provider if: °· You have a fever. °· You have continuous pain in your abdomen. °Get help right away if: °· Your contractions become stronger, more regular, and closer together. °· You have fluid leaking or gushing from your vagina. °· You pass blood-tinged mucus (bloody show). °· You have bleeding from your vagina. °· You have low back pain that you never had before. °· You feel your baby’s head pushing down and causing pelvic pressure. °· Your baby is not moving inside you as much as it used to. °Summary °· Contractions that occur before labor are called Braxton   Hicks contractions, false labor, or practice contractions. °· Braxton Hicks contractions are usually shorter, weaker, farther apart, and less regular than true labor contractions. True labor contractions usually become progressively stronger and regular and they become more frequent. °· Manage discomfort from Braxton Hicks contractions by  changing position, resting in a warm bath, drinking plenty of water, or practicing deep breathing. °This information is not intended to replace advice given to you by your health care provider. Make sure you discuss any questions you have with your health care provider. °Document Released: 07/01/2016 Document Revised: 07/01/2016 Document Reviewed: 07/01/2016 °Elsevier Interactive Patient Education © 2018 Elsevier Inc. ° °

## 2017-02-23 NOTE — Progress Notes (Signed)
   PRENATAL VISIT NOTE  Subjective:  Tami [name] is a 36 y.o. [id number] at 5267w6d being seen today for ongoing prenatal care.  She is currently monitored for the following issues for this low-risk pregnancy and has Drug allergy - PCN; Type B blood, Rh negative; history of HSV-2; Supervision of other normal pregnancy, antepartum; Anti-D antibodies present during pregnancy; Pain of round ligament affecting pregnancy, antepartum; and Abdominal pain in pregnancy, second trimester on their problem list.  Patient reports no complaints.  Contractions: Not present. Vag. Bleeding: None.  Movement: Present. Denies leaking of fluid.   The following portions of the patient's history were reviewed and updated as appropriate: allergies, current medications, past family history, past medical history, past social history, past surgical history and problem list. Problem list updated.  Objective:   Vitals:   02/23/17 1439  BP: 107/84  Pulse: (!) 115  Weight: 225 lb 8 oz (102.3 kg)    Fetal Status: Fetal Heart Rate (bpm): 148 Fundal Height: 30 cm Movement: Present     General:  Alert, oriented and cooperative. Patient is in no acute distress.  Skin: Skin is warm and dry. No rash noted.   Cardiovascular: Normal heart rate noted  Respiratory: Normal respiratory effort, no problems with respiration noted  Abdomen: Soft, gravid, appropriate for gestational age.  Pain/Pressure: Absent     Pelvic: Cervical exam deferred        Extremities: Normal range of motion.  Edema: None  Mental Status:  Normal mood and affect. Normal behavior. Normal judgment and thought content.   Assessment and Plan:  Pregnancy: [id number] at 4367w6d  1. Supervision of other normal pregnancy, antepartum -Routine care  Preterm labor symptoms and general obstetric precautions including but not limited to vaginal bleeding, contractions, leaking of fluid and fetal movement were reviewed in detail with the patient. Please refer to  After Visit Summary for other counseling recommendations.  Return in about 2 weeks (around 03/09/2017) for Return OB visit.   Rolm BookbinderCaroline M Kristoffer Bala, CNM  02/23/17 3:04 PM

## 2017-03-01 NOTE — L&D Delivery Note (Signed)
Patient is 37 y.o. [id number] 1262w2d admitted for labor.   Delivery Note At 0258 a viable female was delivered via  SVD. Patient pushed only 2 times and delivered. Presentation: cephalic,LOA. APGAR: 8,8 ; weight pending.   Placenta status: spontaneous, intact. Cord: 3 vessel  Anesthesia:  none Episiotomy:  none Lacerations:  none Suture Repair: n/a Est. Blood Loss (mL): 100  Mom to postpartum.  Baby to Couplet care / Skin to Skin.  Rolm BookbinderAmber Jamon [place], DO [facility]

## 2017-03-08 ENCOUNTER — Ambulatory Visit (INDEPENDENT_AMBULATORY_CARE_PROVIDER_SITE_OTHER): Payer: Medicaid Other | Admitting: Medical

## 2017-03-08 ENCOUNTER — Encounter: Payer: Self-pay | Admitting: Medical

## 2017-03-08 ENCOUNTER — Encounter: Payer: Self-pay | Admitting: Family Medicine

## 2017-03-08 VITALS — BP 114/77 | HR 94 | Wt 227.1 lb

## 2017-03-08 DIAGNOSIS — O2603 Excessive weight gain in pregnancy, third trimester: Secondary | ICD-10-CM

## 2017-03-08 DIAGNOSIS — Z3483 Encounter for supervision of other normal pregnancy, third trimester: Secondary | ICD-10-CM

## 2017-03-08 DIAGNOSIS — O2600 Excessive weight gain in pregnancy, unspecified trimester: Secondary | ICD-10-CM | POA: Insufficient documentation

## 2017-03-08 DIAGNOSIS — Z3480 Encounter for supervision of other normal pregnancy, unspecified trimester: Secondary | ICD-10-CM

## 2017-03-08 NOTE — Progress Notes (Signed)
   PRENATAL VISIT NOTE  Subjective:  Tami SimpersBrittnie L [name] is a 37 y.o. [id number] at 6440w5d being seen today for ongoing prenatal care.  She is currently monitored for the following issues for this high-risk pregnancy and has Drug allergy - PCN; Type B blood, Rh negative; history of HSV-2; Supervision of other normal pregnancy, antepartum; Anti-D antibodies present during pregnancy; Pain of round ligament affecting pregnancy, antepartum; Abdominal pain in pregnancy, second trimester; and Excessive weight gain in pregnancy on their problem list.  Patient reports no complaints.  Contractions: Not present. Vag. Bleeding: None.  Movement: Present. Denies leaking of fluid.   The following portions of the patient's history were reviewed and updated as appropriate: allergies, current medications, past family history, past medical history, past social history, past surgical history and problem list. Problem list updated.  Objective:   Vitals:   03/08/17 1630  BP: 114/77  Pulse: 94  Weight: 227 lb 1.6 oz (103 kg)    Fetal Status: Fetal Heart Rate (bpm): 140 Fundal Height: 33 cm Movement: Present     General:  Alert, oriented and cooperative. Patient is in no acute distress.  Skin: Skin is warm and dry. No rash noted.   Cardiovascular: Normal heart rate noted  Respiratory: Normal respiratory effort, no problems with respiration noted  Abdomen: Soft, gravid, appropriate for gestational age.  Pain/Pressure: Present     Pelvic: Cervical exam deferred        Extremities: Normal range of motion.  Edema: None  Mental Status:  Normal mood and affect. Normal behavior. Normal judgment and thought content.   Assessment and Plan:  Pregnancy: [id number] at 5040w5d  1. Supervision of other normal pregnancy, antepartum - Doing well, no complaints - 28 week labs reviewed  2. Excessive weight gain during pregnancy in third trimester - Discussed healthy eating habits  Preterm labor symptoms and general obstetric  precautions including but not limited to vaginal bleeding, contractions, leaking of fluid and fetal movement were reviewed in detail with the patient. Please refer to After Visit Summary for other counseling recommendations.  Return in about 2 weeks (around 03/22/2017) for LOB.   Vonzella NippleJulie Gerald Kuehl, PA-C

## 2017-03-08 NOTE — Patient Instructions (Signed)

## 2017-03-23 ENCOUNTER — Ambulatory Visit (INDEPENDENT_AMBULATORY_CARE_PROVIDER_SITE_OTHER): Payer: Medicaid Other | Admitting: Medical

## 2017-03-23 ENCOUNTER — Encounter: Payer: Self-pay | Admitting: General Practice

## 2017-03-23 ENCOUNTER — Encounter: Payer: Self-pay | Admitting: Medical

## 2017-03-23 VITALS — BP 124/69 | HR 94 | Wt 228.4 lb

## 2017-03-23 DIAGNOSIS — Z3480 Encounter for supervision of other normal pregnancy, unspecified trimester: Secondary | ICD-10-CM

## 2017-03-23 DIAGNOSIS — B009 Herpesviral infection, unspecified: Secondary | ICD-10-CM

## 2017-03-23 DIAGNOSIS — Z6721 Type B blood, Rh negative: Secondary | ICD-10-CM

## 2017-03-23 NOTE — Progress Notes (Signed)
   PRENATAL VISIT NOTE  Subjective:  Tami [name] is a 37 y.o. [id number] at 2843w6d being seen today for ongoing prenatal care.  She is currently monitored for the following issues for this high-risk pregnancy and has Drug allergy - PCN; Type B blood, Rh negative; history of HSV-2; Supervision of other normal pregnancy, antepartum; Anti-D antibodies present during pregnancy; Pain of round ligament affecting pregnancy, antepartum; Abdominal pain in pregnancy, second trimester; and Excessive weight gain in pregnancy on their problem list.  Patient reports occasional contractions.  Contractions: Irregular. Vag. Bleeding: None.  Movement: Present. Denies leaking of fluid.   The following portions of the patient's history were reviewed and updated as appropriate: allergies, current medications, past family history, past medical history, past social history, past surgical history and problem list. Problem list updated.  Objective:   Vitals:   03/23/17 1630  BP: 124/69  Pulse: 94  Weight: 228 lb 6.4 oz (103.6 kg)    Fetal Status: Fetal Heart Rate (bpm): 138 Fundal Height: 34 cm Movement: Present     General:  Alert, oriented and cooperative. Patient is in no acute distress.  Skin: Skin is warm and dry. No rash noted.   Cardiovascular: Normal heart rate noted  Respiratory: Normal respiratory effort, no problems with respiration noted  Abdomen: Soft, gravid, appropriate for gestational age.  Pain/Pressure: Present     Pelvic: Cervical exam deferred        Extremities: Normal range of motion.  Edema: None  Mental Status:  Normal mood and affect. Normal behavior. Normal judgment and thought content.   Assessment and Plan:  Pregnancy: [id number] at 5243w6d  1. Supervision of other normal pregnancy, antepartum - doing well, few contractions yesterday, none today   2. Type B blood, Rh negative - Received Rhogam at 28 weeks  3. history of HSV-2 - Will need Valtrex at 36 weeks  Preterm labor  symptoms and general obstetric precautions including but not limited to vaginal bleeding, contractions, leaking of fluid and fetal movement were reviewed in detail with the patient. Please refer to After Visit Summary for other counseling recommendations.  Return in about 2 weeks (around 04/06/2017) for LOB.   Vonzella NippleJulie Erina Hamme, PA-C

## 2017-03-23 NOTE — Patient Instructions (Signed)

## 2017-04-07 ENCOUNTER — Encounter: Payer: Self-pay | Admitting: Student

## 2017-04-07 ENCOUNTER — Ambulatory Visit (INDEPENDENT_AMBULATORY_CARE_PROVIDER_SITE_OTHER): Payer: Medicaid Other | Admitting: Student

## 2017-04-07 VITALS — BP 114/64 | HR 87 | Wt 231.9 lb

## 2017-04-07 DIAGNOSIS — Z3480 Encounter for supervision of other normal pregnancy, unspecified trimester: Secondary | ICD-10-CM

## 2017-04-07 NOTE — Patient Instructions (Signed)

## 2017-04-07 NOTE — Progress Notes (Signed)
   PRENATAL VISIT NOTE  Subjective:  Tami [name] is a 37 y.o. [id number] at 4444w0d being seen today for ongoing prenatal care.  She is currently monitored for the following issues for this low-risk pregnancy and has Drug allergy - PCN; Type B blood, Rh negative; history of HSV-2; Supervision of other normal pregnancy, antepartum; Anti-D antibodies present during pregnancy; Pain of round ligament affecting pregnancy, antepartum; Abdominal pain in pregnancy, second trimester; and Excessive weight gain in pregnancy on their problem list.  Patient reports occasional contractions and back pain.  .  Contractions: Irritability. Vag. Bleeding: None.  Movement: Present. Denies leaking of fluid.   The following portions of the patient's history were reviewed and updated as appropriate: allergies, current medications, past family history, past medical history, past social history, past surgical history and problem list. Problem list updated.  Objective:   Vitals:   04/07/17 1636  BP: 114/64  Pulse: 87  Weight: 231 lb 14.4 oz (105.2 kg)    Fetal Status: Fetal Heart Rate (bpm): 139 Fundal Height: 38 cm Movement: Present     General:  Alert, oriented and cooperative. Patient is in no acute distress.  Skin: Skin is warm and dry. No rash noted.   Cardiovascular: Normal heart rate noted  Respiratory: Normal respiratory effort, no problems with respiration noted  Abdomen: Soft, gravid, appropriate for gestational age.  Pain/Pressure: Present     Pelvic: Cervical exam deferred        Extremities: Normal range of motion.  Edema: Trace  Mental Status:  Normal mood and affect. Normal behavior. Normal judgment and thought content.   Assessment and Plan:  Pregnancy: [id number] at 3144w0d  1. Supervision of other normal pregnancy, antepartum -Doing well, discussed starting Valtrex next week (or today, if she decides) -Patient still not sure about bc options.   Preterm labor symptoms and general obstetric  precautions including but not limited to vaginal bleeding, contractions, leaking of fluid and fetal movement were reviewed in detail with the patient. Please refer to After Visit Summary for other counseling recommendations.  Return 2 weeks LROB.   Tami [name], CNM

## 2017-04-21 ENCOUNTER — Encounter: Payer: Self-pay | Admitting: General Practice

## 2017-04-21 ENCOUNTER — Ambulatory Visit (INDEPENDENT_AMBULATORY_CARE_PROVIDER_SITE_OTHER): Payer: Medicaid Other | Admitting: Student

## 2017-04-21 DIAGNOSIS — Z3480 Encounter for supervision of other normal pregnancy, unspecified trimester: Secondary | ICD-10-CM

## 2017-04-21 DIAGNOSIS — B009 Herpesviral infection, unspecified: Secondary | ICD-10-CM

## 2017-04-22 NOTE — Progress Notes (Signed)
   PRENATAL VISIT NOTE  Subjective:  Donnetta SimpersBrittnie L Lawhorne is a 37 y.o. [id number] at 2369w1d being seen today for ongoing prenatal care.  She is currently monitored for the following issues for this low-risk pregnancy and has Drug allergy - PCN; Type B blood, Rh negative; history of HSV-2; Supervision of other normal pregnancy, antepartum; Anti-D antibodies present during pregnancy; Pain of round ligament affecting pregnancy, antepartum; Abdominal pain in pregnancy, second trimester; and Excessive weight gain in pregnancy on their problem list.  Patient reports no complaints.  Contractions: Irritability. Vag. Bleeding: None.  Movement: Present. Denies leaking of fluid.   The following portions of the patient's history were reviewed and updated as appropriate: allergies, current medications, past family history, past medical history, past social history, past surgical history and problem list. Problem list updated.  Objective:   Vitals:   04/21/17 1633  BP: 117/76  Pulse: 91  Weight: 235 lb 8 oz (106.8 kg)    Fetal Status: Fetal Heart Rate (bpm): 143 Fundal Height: 37 cm Movement: Present     General:  Alert, oriented and cooperative. Patient is in no acute distress.  Skin: Skin is warm and dry. No rash noted.   Cardiovascular: Normal heart rate noted  Respiratory: Normal respiratory effort, no problems with respiration noted  Abdomen: Soft, gravid, appropriate for gestational age.  Pain/Pressure: Present     Pelvic: Cervical exam deferred        Extremities: Normal range of motion.  Edema: Trace  Mental Status:  Normal mood and affect. Normal behavior. Normal judgment and thought content.   Assessment and Plan:  Pregnancy: [id number] at 1169w1d  1. history of HSV-2 Taking Valtrex   2. Supervision of other normal pregnancy, antepartum Doing well, no complaints. Patient refused to do cultures and verify presentation today; agreeable to exam and cultures next week.   Term labor symptoms and  general obstetric precautions including but not limited to vaginal bleeding, contractions, leaking of fluid and fetal movement were reviewed in detail with the patient. Please refer to After Visit Summary for other counseling recommendations.  Return in about 1 week (around 04/28/2017).   Marylene LandKathryn Lorraine Janson Lamar, CNM

## 2017-04-27 ENCOUNTER — Other Ambulatory Visit (HOSPITAL_COMMUNITY)
Admission: RE | Admit: 2017-04-27 | Discharge: 2017-04-27 | Disposition: A | Discharge status: Home / Self Care | Payer: Medicaid Other | Source: Ambulatory Visit | Attending: Obstetrics & Gynecology | Admitting: Obstetrics & Gynecology

## 2017-04-27 ENCOUNTER — Ambulatory Visit (INDEPENDENT_AMBULATORY_CARE_PROVIDER_SITE_OTHER): Payer: Medicaid Other | Admitting: Obstetrics & Gynecology

## 2017-04-27 VITALS — BP 134/87 | HR 85 | Wt 238.0 lb

## 2017-04-27 DIAGNOSIS — Z3483 Encounter for supervision of other normal pregnancy, third trimester: Secondary | ICD-10-CM | POA: Diagnosis not present

## 2017-04-27 DIAGNOSIS — Z3480 Encounter for supervision of other normal pregnancy, unspecified trimester: Secondary | ICD-10-CM

## 2017-04-27 DIAGNOSIS — O09529 Supervision of elderly multigravida, unspecified trimester: Secondary | ICD-10-CM | POA: Insufficient documentation

## 2017-04-27 DIAGNOSIS — B009 Herpesviral infection, unspecified: Secondary | ICD-10-CM

## 2017-04-27 DIAGNOSIS — Z6839 Body mass index (BMI) 39.0-39.9, adult: Secondary | ICD-10-CM | POA: Insufficient documentation

## 2017-04-27 NOTE — Progress Notes (Signed)
   PRENATAL VISIT NOTE  Subjective:  Tami [name] is a 37 y.o. [id number] at 8435w6d being seen today for ongoing prenatal care.  She is currently monitored for the following issues for this low-risk pregnancy and has Drug allergy - PCN; Type B blood, Rh negative; history of HSV-2; Supervision of other normal pregnancy, antepartum; Anti-D antibodies present during pregnancy; Pain of round ligament affecting pregnancy, antepartum; Abdominal pain in pregnancy, second trimester; Excessive weight gain in pregnancy; AMA (advanced maternal age) multigravida 35+; and Obesity in pregnancy on their problem list.  Patient reports no complaints.  Contractions: Irritability. Vag. Bleeding: None.  Movement: Present. Denies leaking of fluid.   The following portions of the patient's history were reviewed and updated as appropriate: allergies, current medications, past family history, past medical history, past social history, past surgical history and problem list. Problem list updated.  Objective:   Vitals:   04/27/17 1619 04/27/17 1624  BP: 139/71 134/87  Pulse: 85   Weight: 238 lb (108 kg)     Fetal Status: Fetal Heart Rate (bpm): 138   Movement: Present     General:  Alert, oriented and cooperative. Patient is in no acute distress.  Skin: Skin is warm and dry. No rash noted.   Cardiovascular: Normal heart rate noted  Respiratory: Normal respiratory effort, no problems with respiration noted  Abdomen: Soft, gravid, appropriate for gestational age.  Pain/Pressure: Present     Pelvic: Cervical exam performed        Extremities: Normal range of motion.  Edema: Trace  Mental Status:  Normal mood and affect. Normal behavior. Normal judgment and thought content.   Assessment and Plan:  Pregnancy: [id number] at 7235w6d  1. Supervision of other normal pregnancy, antepartum - rec come back to hospital when contracting regularly - Cervicovaginal ancillary only - Strep Gp B Culture+Rflx  2. history of  HSV- Continue valtrex  Preterm labor symptoms and general obstetric precautions including but not limited to vaginal bleeding, contractions, leaking of fluid and fetal movement were reviewed in detail with the patient. Please refer to After Visit Summary for other counseling recommendations.  Return in about 1 week (around 05/04/2017).   Allie BossierMyra C Notnamed Scholz, MD

## 2017-04-28 LAB — CERVICOVAGINAL ANCILLARY ONLY
CHLAMYDIA, DNA PROBE: NEGATIVE
Neisseria Gonorrhea: NEGATIVE

## 2017-04-29 ENCOUNTER — Inpatient Hospital Stay (HOSPITAL_COMMUNITY)
Admission: AD | Admit: 2017-04-29 | Discharge: 2017-05-02 | DRG: 807 | Disposition: A | Discharge status: Home / Self Care | Payer: Medicaid Other | Source: Ambulatory Visit | Attending: Family Medicine | Admitting: Family Medicine

## 2017-04-29 DIAGNOSIS — O99210 Obesity complicating pregnancy, unspecified trimester: Secondary | ICD-10-CM

## 2017-04-29 DIAGNOSIS — Z3480 Encounter for supervision of other normal pregnancy, unspecified trimester: Secondary | ICD-10-CM

## 2017-04-29 DIAGNOSIS — O99824 Streptococcus B carrier state complicating childbirth: Secondary | ICD-10-CM | POA: Diagnosis present

## 2017-04-29 DIAGNOSIS — Z6791 Unspecified blood type, Rh negative: Secondary | ICD-10-CM

## 2017-04-29 DIAGNOSIS — O99214 Obesity complicating childbirth: Secondary | ICD-10-CM | POA: Diagnosis present

## 2017-04-29 DIAGNOSIS — E669 Obesity, unspecified: Secondary | ICD-10-CM | POA: Diagnosis present

## 2017-04-29 DIAGNOSIS — Z880 Allergy status to penicillin: Secondary | ICD-10-CM

## 2017-04-29 DIAGNOSIS — O26893 Other specified pregnancy related conditions, third trimester: Secondary | ICD-10-CM | POA: Diagnosis present

## 2017-04-29 DIAGNOSIS — Z3A38 38 weeks gestation of pregnancy: Secondary | ICD-10-CM

## 2017-04-29 DIAGNOSIS — O134 Gestational [pregnancy-induced] hypertension without significant proteinuria, complicating childbirth: Principal | ICD-10-CM | POA: Diagnosis present

## 2017-04-29 DIAGNOSIS — O360120 Maternal care for anti-D [Rh] antibodies, second trimester, not applicable or unspecified: Secondary | ICD-10-CM

## 2017-04-29 DIAGNOSIS — O09523 Supervision of elderly multigravida, third trimester: Secondary | ICD-10-CM

## 2017-04-30 ENCOUNTER — Encounter (HOSPITAL_COMMUNITY): Payer: Self-pay | Admitting: General Practice

## 2017-04-30 DIAGNOSIS — Z3A38 38 weeks gestation of pregnancy: Secondary | ICD-10-CM | POA: Diagnosis not present

## 2017-04-30 DIAGNOSIS — O134 Gestational [pregnancy-induced] hypertension without significant proteinuria, complicating childbirth: Secondary | ICD-10-CM | POA: Diagnosis present

## 2017-04-30 DIAGNOSIS — O99824 Streptococcus B carrier state complicating childbirth: Secondary | ICD-10-CM | POA: Diagnosis present

## 2017-04-30 DIAGNOSIS — E669 Obesity, unspecified: Secondary | ICD-10-CM | POA: Diagnosis present

## 2017-04-30 DIAGNOSIS — O99214 Obesity complicating childbirth: Secondary | ICD-10-CM | POA: Diagnosis present

## 2017-04-30 DIAGNOSIS — O1092 Unspecified pre-existing hypertension complicating childbirth: Secondary | ICD-10-CM

## 2017-04-30 DIAGNOSIS — Z880 Allergy status to penicillin: Secondary | ICD-10-CM | POA: Diagnosis not present

## 2017-04-30 DIAGNOSIS — O26893 Other specified pregnancy related conditions, third trimester: Secondary | ICD-10-CM | POA: Diagnosis present

## 2017-04-30 DIAGNOSIS — Z6791 Unspecified blood type, Rh negative: Secondary | ICD-10-CM | POA: Diagnosis not present

## 2017-04-30 LAB — CBC
HEMATOCRIT: 39.4 % (ref 36.0–46.0)
HEMOGLOBIN: 13.6 g/dL (ref 12.0–15.0)
MCH: 28.5 pg (ref 26.0–34.0)
MCHC: 34.5 g/dL (ref 30.0–36.0)
MCV: 82.6 fL (ref 78.0–100.0)
Platelets: 216 10*3/uL (ref 150–400)
RBC: 4.77 MIL/uL (ref 3.87–5.11)
RDW: 16.1 % — AB (ref 11.5–15.5)
WBC: 12.2 10*3/uL — AB (ref 4.0–10.5)

## 2017-04-30 LAB — COMPREHENSIVE METABOLIC PANEL
ALBUMIN: 2.9 g/dL — AB (ref 3.5–5.0)
ALT: 19 U/L (ref 14–54)
AST: 24 U/L (ref 15–41)
Alkaline Phosphatase: 88 U/L (ref 38–126)
Anion gap: 8 (ref 5–15)
BILIRUBIN TOTAL: 0.8 mg/dL (ref 0.3–1.2)
BUN: 16 mg/dL (ref 6–20)
CO2: 17 mmol/L — ABNORMAL LOW (ref 22–32)
CREATININE: 0.88 mg/dL (ref 0.44–1.00)
Calcium: 9.6 mg/dL (ref 8.9–10.3)
Chloride: 107 mmol/L (ref 101–111)
GFR calc Af Amer: >60 mL/min (ref >60)
GLUCOSE: 105 mg/dL — AB (ref 65–99)
POTASSIUM: 4.2 mmol/L (ref 3.5–5.1)
Sodium: 132 mmol/L — ABNORMAL LOW (ref 135–145)
TOTAL PROTEIN: 7.5 g/dL (ref 6.5–8.1)

## 2017-04-30 LAB — TYPE AND SCREEN
ABO/RH(D): B NEG
ANTIBODY SCREEN: NEGATIVE

## 2017-04-30 LAB — PROTEIN / CREATININE RATIO, URINE
CREATININE, URINE: 137 mg/dL
PROTEIN CREATININE RATIO: 0.11 mg/mg{creat} (ref 0.00–0.15)
TOTAL PROTEIN, URINE: 15 mg/dL

## 2017-04-30 LAB — GROUP B STREP BY PCR: Group B strep by PCR: POSITIVE — AB

## 2017-04-30 LAB — RPR: RPR: NONREACTIVE

## 2017-04-30 LAB — OB RESULTS CONSOLE GBS: STREP GROUP B AG: POSITIVE

## 2017-04-30 MED ORDER — BENZOCAINE-MENTHOL 20-0.5 % EX AERO: 1.0000 "application " | INHALATION_SPRAY | CUTANEOUS | Status: DC | PRN

## 2017-04-30 MED ORDER — ACETAMINOPHEN 325 MG PO TABS
650.0000 mg | ORAL_TABLET | ORAL | Status: DC | PRN
  Filled 2017-04-30 (×3): qty 2

## 2017-04-30 MED ORDER — PHENYLEPHRINE 40 MCG/ML (10ML) SYRINGE FOR IV PUSH (FOR BLOOD PRESSURE SUPPORT)
PREFILLED_SYRINGE | INTRAVENOUS | Status: AC
  Filled 2017-04-30: qty 20

## 2017-04-30 MED ORDER — PRENATAL MULTIVITAMIN CH
1.0000 | ORAL_TABLET | Freq: Every day | ORAL | Status: DC
  Administered 2017-04-30 – 2017-05-01 (×2): 1 via ORAL
  Filled 2017-04-30: qty 1

## 2017-04-30 MED ORDER — ACETAMINOPHEN 325 MG PO TABS
650.0000 mg | ORAL_TABLET | ORAL | Status: DC | PRN
  Administered 2017-04-30 – 2017-05-01 (×4): 650 mg via ORAL
  Filled 2017-04-30: qty 2

## 2017-04-30 MED ORDER — ONDANSETRON HCL 4 MG/2ML IJ SOLN: 4.0000 mg | Freq: Four times a day (QID) | INTRAMUSCULAR | Status: DC | PRN

## 2017-04-30 MED ORDER — TETANUS-DIPHTH-ACELL PERTUSSIS 5-2.5-18.5 LF-MCG/0.5 IM SUSP
0.5000 mL | Freq: Once | INTRAMUSCULAR | Status: DC
  Filled 2017-04-30: qty 0.5

## 2017-04-30 MED ORDER — ZOLPIDEM TARTRATE 5 MG PO TABS: 5.0000 mg | ORAL_TABLET | Freq: Every evening | ORAL | Status: DC | PRN

## 2017-04-30 MED ORDER — EPHEDRINE 5 MG/ML INJ
10.0000 mg | INTRAVENOUS | Status: DC | PRN
  Filled 2017-04-30: qty 2

## 2017-04-30 MED ORDER — FENTANYL 2.5 MCG/ML BUPIVACAINE 1/10 % EPIDURAL INFUSION (WH - ANES)
INTRAMUSCULAR | Status: AC
  Filled 2017-04-30: qty 100

## 2017-04-30 MED ORDER — OXYCODONE-ACETAMINOPHEN 5-325 MG PO TABS: 1.0000 | ORAL_TABLET | ORAL | Status: DC | PRN

## 2017-04-30 MED ORDER — SOD CITRATE-CITRIC ACID 500-334 MG/5ML PO SOLN: 30.0000 mL | ORAL | Status: DC | PRN

## 2017-04-30 MED ORDER — LACTATED RINGERS IV SOLN: 500.0000 mL | Freq: Once | INTRAVENOUS | Status: DC

## 2017-04-30 MED ORDER — LACTATED RINGERS IV SOLN: INTRAVENOUS | Status: DC

## 2017-04-30 MED ORDER — SIMETHICONE 80 MG PO CHEW: 80.0000 mg | CHEWABLE_TABLET | ORAL | Status: DC | PRN

## 2017-04-30 MED ORDER — COCONUT OIL OIL
1.0000 "application " | TOPICAL_OIL | Status: DC | PRN
  Administered 2017-04-30: 1 via TOPICAL
  Filled 2017-04-30: qty 120

## 2017-04-30 MED ORDER — IBUPROFEN 600 MG PO TABS
600.0000 mg | ORAL_TABLET | Freq: Four times a day (QID) | ORAL | Status: DC
  Administered 2017-04-30 – 2017-05-01 (×7): 600 mg via ORAL
  Filled 2017-04-30 (×6): qty 1

## 2017-04-30 MED ORDER — OXYTOCIN BOLUS FROM INFUSION
500.0000 mL | Freq: Once | INTRAVENOUS | Status: AC
  Administered 2017-04-30: 500 mL via INTRAVENOUS

## 2017-04-30 MED ORDER — DIPHENHYDRAMINE HCL 25 MG PO CAPS: 25.0000 mg | ORAL_CAPSULE | Freq: Four times a day (QID) | ORAL | Status: DC | PRN

## 2017-04-30 MED ORDER — ONDANSETRON HCL 4 MG PO TABS: 4.0000 mg | ORAL_TABLET | ORAL | Status: DC | PRN

## 2017-04-30 MED ORDER — OXYTOCIN 40 UNITS IN LACTATED RINGERS INFUSION - SIMPLE MED
2.5000 [IU]/h | INTRAVENOUS | Status: DC
  Administered 2017-04-30: 2.5 [IU]/h via INTRAVENOUS
  Filled 2017-04-30: qty 1000

## 2017-04-30 MED ORDER — OXYCODONE-ACETAMINOPHEN 5-325 MG PO TABS: 2.0000 | ORAL_TABLET | ORAL | Status: DC | PRN

## 2017-04-30 MED ORDER — DIPHENHYDRAMINE HCL 50 MG/ML IJ SOLN: 12.5000 mg | INTRAMUSCULAR | Status: DC | PRN

## 2017-04-30 MED ORDER — SENNOSIDES-DOCUSATE SODIUM 8.6-50 MG PO TABS
2.0000 | ORAL_TABLET | ORAL | Status: DC
  Administered 2017-05-01 – 2017-05-02 (×2): 2 via ORAL
  Filled 2017-04-30 (×2): qty 2

## 2017-04-30 MED ORDER — PHENYLEPHRINE 40 MCG/ML (10ML) SYRINGE FOR IV PUSH (FOR BLOOD PRESSURE SUPPORT)
80.0000 ug | PREFILLED_SYRINGE | INTRAVENOUS | Status: DC | PRN
  Filled 2017-04-30: qty 5

## 2017-04-30 MED ORDER — RHO D IMMUNE GLOBULIN 1500 UNIT/2ML IJ SOSY
300.0000 ug | PREFILLED_SYRINGE | Freq: Once | INTRAMUSCULAR | Status: AC
  Administered 2017-04-30: 300 ug via INTRAVENOUS
  Filled 2017-04-30: qty 2

## 2017-04-30 MED ORDER — LIDOCAINE HCL (PF) 1 % IJ SOLN
30.0000 mL | INTRAMUSCULAR | Status: DC | PRN
  Filled 2017-04-30: qty 30

## 2017-04-30 MED ORDER — FENTANYL 2.5 MCG/ML BUPIVACAINE 1/10 % EPIDURAL INFUSION (WH - ANES): 14.0000 mL/h | INTRAMUSCULAR | Status: DC | PRN

## 2017-04-30 MED ORDER — LACTATED RINGERS IV SOLN: 500.0000 mL | INTRAVENOUS | Status: DC | PRN

## 2017-04-30 MED ORDER — WITCH HAZEL-GLYCERIN EX PADS: 1.0000 "application " | MEDICATED_PAD | CUTANEOUS | Status: DC | PRN

## 2017-04-30 MED ORDER — FENTANYL CITRATE (PF) 100 MCG/2ML IJ SOLN: 100.0000 ug | INTRAMUSCULAR | Status: DC | PRN

## 2017-04-30 MED ORDER — ONDANSETRON HCL 4 MG/2ML IJ SOLN: 4.0000 mg | INTRAMUSCULAR | Status: DC | PRN

## 2017-04-30 MED ORDER — DIBUCAINE 1 % RE OINT: 1.0000 "application " | TOPICAL_OINTMENT | RECTAL | Status: DC | PRN

## 2017-04-30 NOTE — Plan of Care (Signed)
POC discussed with pt and support person, no questions or concerns at this time.

## 2017-04-30 NOTE — MAU Note (Signed)
Urine sent to lab 

## 2017-04-30 NOTE — H&P (Signed)
Tami [name] is a 37 y.o. female [phone] @ 38.2wks by 6wk scan presenting for reg ctx. Denies leaking or bldg; denies H/A, N/V or visual disturbances. Denies s/s HSV outbreak. Her preg has been followed by the [facility] service and has been remarkable for 1) AMA 2) Rh neg 3) obesity 4) hx HSV 5) PCN allergy  OB History    Gravida Para Term Preterm AB Living   5 3 3   1 3    SAB TAB Ectopic Multiple Live Births     1     3     Past Medical History:  Diagnosis Date  . Asthma    as child  . Blood type, Rh negative B -  . Depression    ok  . GBS (group B Streptococcus carrier), +RV culture, currently pregnant 06/30/2012   C&S showed Resistant to EES and Clindamycin; sensitive to Vanc  . Genital herpes    rare outbreaks  . Gonorrhea complicating pregnancy in first trimester 12/09/2011   Positive cx on pap done 12/07/11  . Group B streptococcal infection in pregnancy 2006  . history of HSV-2 12/01/2011   Last outbreak reported at [facility] interview in 2003  . Infection    Yeast inf;Not frequent lately  . NSVD (normal spontaneous vaginal delivery) 07/08/2012  . Trichomonas infection   . Type B blood, Rh negative 12/07/2011  . Unsure of LMP (last menstrual period) as reason for ultrasound scan 12/07/2011   EDC by 7week u/s   Past Surgical History:  Procedure Laterality Date  . THERAPEUTIC ABORTION    . WISDOM TOOTH EXTRACTION  04/2011   All 4 removed   Family History: family history includes Asthma in her daughter; Diabetes in her maternal grandmother; Migraines in her maternal grandmother; Stroke in her paternal grandmother; Thyroid disease in her mother. Social History:  reports that  has never smoked. she has never used smokeless tobacco. She reports that she does not drink alcohol or use drugs.     Maternal Diabetes: No Genetic Screening: Normal Maternal Ultrasounds/Referrals: Normal Fetal Ultrasounds or other Referrals:  None Maternal Substance Abuse:  No Significant Maternal  Medications:  None Significant Maternal Lab Results:  Lab values include: Other:  GBS PCR pending Other Comments:  None  ROS History Dilation: 5 Effacement (%): 70 Exam by:: T. Lytle RN Blood pressure (!) 141/86, pulse 95, temperature 98.2 F (36.8 C), resp. rate 18, height 5\' 2"  (1.575 m), weight 108.4 kg (239 lb), last menstrual period 07/26/2016, SpO2 98 %.   BPs:  139/93, 139/89  Exam Physical Exam  Constitutional: She is oriented to person, place, and time. She appears well-developed.  HENT:  Head: Normocephalic.  Neck: Normal range of motion.  Cardiovascular: Normal rate.  Respiratory: Effort normal.  GI:  EFM 140s, +accels, no decels Ctx q 3 mins  Musculoskeletal: Normal range of motion.  Neurological: She is alert and oriented to person, place, and time.  Skin: Skin is warm and dry.  Psychiatric: She has a normal mood and affect. Her behavior is normal. Thought content normal.    Prenatal labs: ABO, Rh: B/Negative/-- (09/05 0000) Antibody: Positive, See Final Results (09/05 0000) Rubella: 2.88 (09/05 0000) RPR: Non Reactive (12/12 0951)  HBsAg: Negative (09/05 0000)  HIV: Non Reactive (12/12 0951)  GBS:   pending  Assessment/Plan: IUP@term  Latent labor gHTN noted in MAU GBS culture pending from 2/27  Admit to [facility] Expectant management to start; may augment prn Pre-e labs pending; GBS  PCR pending Anticipate SVD Rhogam eval PP MOF: Breast and bottle MOC: undecided  [employer]ubb Corporationmber Noga Fogg, DO

## 2017-04-30 NOTE — Lactation Note (Addendum)
This note was copied from a baby's chart. Lactation Consultation Note  Patient Name: Tami [name] Reason for consult: Initial assessment;Early term 5737-38.6wks  15 hours old early term female who is being exclusively BF by her mother, she's a P4. Mom was concerned because baby hasn't had a stool yet and only 1 wet diaper since birth, she felt unsure about her milk supply. Assisted mom with latch, undressed baby down to her diaper and mom discovered baby just had a wet diaper, recorded it on Flowsheets.  Baby was too sleepy to latch, she kept falling asleep, mom let LC know that baby just fed about 2 hours ago, baby wasn't ready to feed yet nor she showed signs of readiness. Per mom feedings at the breast were comfortable, nipples look intact upon examination, her only concern was that baby wasn't voiding well and she was worried about baby getting "enough". Discussed the possibility of pumping with mom but she's not interested at this point, she would rather feed at the breast than pump.  Encouraged mom to follow LPI guidelines and wake baby up every 3 hours to feed. If baby doesn't latch mom will hand express and feed that milk back to baby. Caring for your pre-term baby was discussed, as well as BF brochure, BF resources and BF diary. Mom is aware of LC services and will call PRN.  Maternal Data Formula Feeding for Exclusion: No Has patient been taught Hand Expression?: Yes Does the patient have breastfeeding experience prior to this delivery?: Yes  Feeding Feeding Type: Breast Milk Length of feed: 20 min    Interventions Interventions: Breast feeding basics reviewed;Assisted with latch;Skin to skin;Breast massage;Breast compression;Support pillows;Position options  Lactation Tools Discussed/Used     Consult Status Consult Status: Follow-up Date: 05/01/17 Follow-up type: In-patient    Kati Riggenbach Venetia ConstableS Geraldyne Barraclough [name], 6:37 PM

## 2017-04-30 NOTE — MAU Note (Signed)
Pt began contracting regularly around 2300 last night about.  Blood show. No leaking of fluid.  + FM

## 2017-04-30 NOTE — Progress Notes (Signed)
Nurse ar BS during Bf attempt.  Mother concerned that infant is not "getting enough".  LATCh score of 7 given, several attempts made to latch with no sustained latch.  Hand expression, breast massage, breast compression and spoon feeding taught to mother.  Mother demonstrated technique and verbalized understanding.

## 2017-05-01 LAB — RH IG WORKUP (INCLUDES ABO/RH)
ABO/RH(D): B NEG
FETAL SCREEN: NEGATIVE
GESTATIONAL AGE(WKS): 38
Unit division: 0

## 2017-05-01 NOTE — Progress Notes (Signed)
Post Partum Day 1 Subjective: no complaints, up ad lib, voiding and tolerating PO  Objective: Blood pressure 134/87, pulse (!) 101, temperature 98 F (36.7 C), resp. rate 16, height 5\' 2"  (1.575 m), weight 101.7 kg (224 lb 3.2 oz), last menstrual period 07/26/2016, SpO2 99 %, unknown if currently breastfeeding.  Physical Exam:  General: alert, cooperative and no distress Lochia: appropriate Uterine Fundus: firm DVT Evaluation: No evidence of DVT seen on physical exam. No cords or calf tenderness. No significant calf/ankle edema.  Recent Labs    04/30/17 0220  HGB 13.6  HCT 39.4    Assessment/Plan: Plan for discharge tomorrow  Continue routine care Monitor BPs; currently wnl S/p Rhogam   LOS: 1 day   Caryl AdaJazma Phelps, DO 05/01/2017, 7:21 AM

## 2017-05-02 LAB — STREP GP B SUSCEPTIBILITY

## 2017-05-02 LAB — STREP GP B CULTURE+RFLX: STREP GP B CULTURE+RFLX: POSITIVE — AB

## 2017-05-02 MED ORDER — IBUPROFEN 600 MG PO TABS: 600.0000 mg | ORAL_TABLET | Freq: Four times a day (QID) | ORAL | 0 refills | Status: DC

## 2017-05-02 NOTE — Lactation Note (Addendum)
This note was copied from a baby's chart. Lactation Consultation Note  Patient Name: Tami [name] NWGNF'AToday's Date: 05/02/2017     P4, Baby 53 hours old.  Mother has great flow of transitional breastmilk with hand expression.  Mother has flat inverted nipples that have cracks and abrasions.  Positional stripe on R nipple. Mother has coconut oil.  Suggest she alternate with shells and coconut oil and then comfort gels to help heal nipples. Discussed possibly pumping for 10 min per side to give nipples time to heal. Mom encouraged to feed baby 8-12 times/24 hours and with feeding cues.  Reviewed engorgement care and monitoring voids/stools. Worked in both football hold and cradle hold and to try to achieve a deeper latch but mother still wincing in pain.  At this time mother was not willing to try hand pump. Mother requested a NS. Attempted w/ #20NS which fully everted nipple and shield was full of breastmilk but mother states using the the nipple shield was too painful.    Maternal Data    Feeding Feeding Type: Breast Fed Length of feed: 15 min  LATCH Score Latch: Repeated attempts needed to sustain latch, nipple held in mouth throughout feeding, stimulation needed to elicit sucking reflex.  Audible Swallowing: A few with stimulation  Type of Nipple: Flat  Comfort (Breast/Nipple): Filling, red/small blisters or bruises, mild/mod discomfort  Hold (Positioning): Assistance needed to correctly position infant at breast and maintain latch.  LATCH Score: 5  Interventions Interventions: Assisted with latch;Skin to skin;Hand express;Breast compression;Adjust position;Support pillows;Position options;Expressed milk;Coconut oil;Shells;Comfort gels;Hand pump  Lactation Tools Discussed/Used Tools: Nipple Shields;Comfort gels;Coconut oil;Shells;Pump   Consult Status      Dahlia ByesBerkelhammer, Evonne Rinks [facility] 05/02/2017, 9:02 AM

## 2017-05-02 NOTE — Discharge Summary (Addendum)
OB Discharge Summary    Patient Name: Tami [name] DOB: 1980/05/25 MRN: [id number] Date of admission: 04/29/2017  Delivering MD: Rolm Bookbinder )  Date of discharge: 05/02/2017  Admitting diagnosis: SOL Intrauterine pregnancy: [redacted]w[redacted]d    Secondary diagnosis:  Active Problems:   Patient Active Problem List   Diagnosis Date Noted  . SVD (spontaneous vaginal delivery) 04/30/2017  . AMA (advanced maternal age) multigravida 35+ 04/27/2017  . Obesity in pregnancy 04/27/2017  . Excessive weight gain in pregnancy 03/08/2017  . Pain of round ligament affecting pregnancy, antepartum 12/28/2016  . Abdominal pain in pregnancy, second trimester 12/28/2016  . Anti-D antibodies present during pregnancy 15-Nov-2016  . Supervision of other normal pregnancy, antepartum 11/03/2016  . Drug allergy - PCN 03/27/2012  . Type B blood, Rh negative 12/07/2011  . history of HSV-2 12/01/2011   Additional problems:  Gestational HTN noted prior to delivery GBS positive - no prophylaxis d/t precipitous labor     Discharge diagnosis: Term Pregnancy Delivered                                                                                                Post partum procedures: Rhogam  Complications: None  Hospital course:  Onset of Labor With Vaginal Delivery     37 y.o. [id number] at [redacted]w[redacted]d was admitted in Latent Labor on 04/29/2017. GBS positive although no prophylactic treatment due to precipitous labor. Patient had an uncomplicated labor course as follows:  Membrane Rupture Time/Date: 2:42 AM ,04/30/2017   Intrapartum Procedures: Episiotomy: None [1]                                         Lacerations:  None [1]  Patient had a delivery of a Viable infant. 04/30/2017  Information for the patient's newborn:  Azaya, Goedde [[id number]]  Delivery Method: Vag-Spont   Pateint had an uncomplicated postpartum course.  She is ambulating, tolerating a regular diet, passing flatus, and urinating well. BP within  normal limits. Patient is discharged home in stable condition on 05/02/17.  Physical exam  Vitals:   05/01/17 0900 05/01/17 2035  BP: 114/68 130/82  Pulse: 100 92  Resp: 16 18  Temp: 98 F (36.7 C) 98.1 F (36.7 C)  SpO2: 100% 100%    General: alert, cooperative and no distress Lochia: appropriate Uterine Fundus: firm Incision: N/A DVT Evaluation: No evidence of DVT seen on physical exam.  Labs: No results found for this or any previous visit (from the past 24 hour(s)).   Discharge instruction: per After Visit Summary and "Baby and Me Booklet".  After visit meds:  Allergies  Allergen Reactions  . Amoxicillin Hives    Has patient had a PCN reaction causing immediate rash, facial/tongue/throat swelling, SOB or lightheadedness with hypotension: Unknown Has patient had a PCN reaction causing severe rash involving mucus membranes or skin necrosis: Unknown Has patient had a PCN reaction that required hospitalization: No Has patient had a PCN reaction occurring within the last 10 years: No  .  Penicillins Hives    Has patient had a PCN reaction causing immediate rash, facial/tongue/throat swelling, SOB or lightheadedness with hypotension: Unknown Has patient had a PCN reaction causing severe rash involving mucus membranes or skin necrosis: Unknown Has patient had a PCN reaction that required hospitalization: No Has patient had a PCN reaction occurring within the last 10 years: No If all of the above answers are "NO", then may proceed with Cephalosporin use.     Allergies as of 05/02/2017      Reactions   Amoxicillin Hives   Has patient had a PCN reaction causing immediate rash, facial/tongue/throat swelling, SOB or lightheadedness with hypotension: Unknown Has patient had a PCN reaction causing severe rash involving mucus membranes or skin necrosis: Unknown Has patient had a PCN reaction that required hospitalization: No Has patient had a PCN reaction occurring within the last  10 years: No   Penicillins Hives   Has patient had a PCN reaction causing immediate rash, facial/tongue/throat swelling, SOB or lightheadedness with hypotension: Unknown Has patient had a PCN reaction causing severe rash involving mucus membranes or skin necrosis: Unknown Has patient had a PCN reaction that required hospitalization: No Has patient had a PCN reaction occurring within the last 10 years: No If all of the above answers are "NO", then may proceed with Cephalosporin use.      Medication List    STOP taking these medications   valACYclovir 500 MG tablet Commonly known as:  VALTREX     TAKE these medications   acetaminophen 500 MG tablet Commonly known as:  TYLENOL Take 500 mg by mouth every 6 (six) hours as needed for mild pain. For pain   CONCEPT OB 130-92.4-1 MG Caps Take 1 tablet by mouth daily.   ibuprofen 600 MG tablet Commonly known as:  ADVIL,MOTRIN Take 1 tablet (600 mg total) by mouth every 6 (six) hours.       Diet: routine diet  Activity: Advance as tolerated. Pelvic rest for 6 weeks.   Outpatient follow up:4 weeks Future Appointments: No future appointments.  Follow up Appt: No Follow-up on file.  Postpartum contraception: IUD    Newborn Data: APGAR (1 MIN): 8   APGAR (5 MINS): 9    Baby Feeding: Bottle and Breast Disposition:home with mother  Ellwood Denselison Rumball, DO  05/02/2017  OB FELLOW DISCHARGE ATTESTATION  I have seen and examined this patient and agree with above documentation in the resident's note.   Frederik PearJulie P Kainen Struckman, MD OB Fellow 9:08 AM

## 2017-05-02 NOTE — Discharge Instructions (Signed)
Vaginal Delivery, Care After °Refer to this sheet in the next few weeks. These instructions provide you with information about caring for yourself after vaginal delivery. Your health care provider may also give you more specific instructions. Your treatment has been planned according to current medical practices, but problems sometimes occur. Call your health care provider if you have any problems or questions. °What can I expect after the procedure? °After vaginal delivery, it is common to have: °· Some bleeding from your vagina. °· Soreness in your abdomen, your vagina, and the area of skin between your vaginal opening and your anus (perineum). °· Pelvic cramps. °· Fatigue. ° °Follow these instructions at home: °Medicines °· Take over-the-counter and prescription medicines only as told by your health care provider. °· If you were prescribed an antibiotic medicine, take it as told by your health care provider. Do not stop taking the antibiotic until it is finished. °Driving ° °· Do not drive or operate heavy machinery while taking prescription pain medicine. °· Do not drive for 24 hours if you received a sedative. °Lifestyle °· Do not drink alcohol. This is especially important if you are breastfeeding or taking medicine to relieve pain. °· Do not use tobacco products, including cigarettes, chewing tobacco, or e-cigarettes. If you need help quitting, ask your health care provider. °Eating and drinking °· Drink at least 8 eight-ounce glasses of water every day unless you are told not to by your health care provider. If you choose to breastfeed your baby, you may need to drink more water than this. °· Eat high-fiber foods every day. These foods may help prevent or relieve constipation. High-fiber foods include: °? Whole grain cereals and breads. °? Brown rice. °? Beans. °? Fresh fruits and vegetables. °Activity °· Return to your normal activities as told by your health care provider. Ask your health care provider  what activities are safe for you. °· Rest as much as possible. Try to rest or take a nap when your baby is sleeping. °· Do not lift anything that is heavier than your baby or 10 lb (4.5 kg) until your health care provider says that it is safe. °· Talk with your health care provider about when you can engage in sexual activity. This may depend on your: °? Risk of infection. °? Rate of healing. °? Comfort and desire to engage in sexual activity. °Vaginal Care °· If you have an episiotomy or a vaginal tear, check the area every day for signs of infection. Check for: °? More redness, swelling, or pain. °? More fluid or blood. °? Warmth. °? Pus or a bad smell. °· Do not use tampons or douches until your health care provider says this is safe. °· Watch for any blood clots that may pass from your vagina. These may look like clumps of dark red, brown, or black discharge. °General instructions °· Keep your perineum clean and dry as told by your health care provider. °· Wear loose, comfortable clothing. °· Wipe from front to back when you use the toilet. °· Ask your health care provider if you can shower or take a bath. If you had an episiotomy or a perineal tear during labor and delivery, your health care provider may tell you not to take baths for a certain length of time. °· Wear a bra that supports your breasts and fits you well. °· If possible, have someone help you with household activities and help care for your baby for at least a few days after   you leave the hospital. °· Keep all follow-up visits for you and your baby as told by your health care provider. This is important. °Contact a health care provider if: °· You have: °? Vaginal discharge that has a bad smell. °? Difficulty urinating. °? Pain when urinating. °? A sudden increase or decrease in the frequency of your bowel movements. °? More redness, swelling, or pain around your episiotomy or vaginal tear. °? More fluid or blood coming from your episiotomy or  vaginal tear. °? Pus or a bad smell coming from your episiotomy or vaginal tear. °? A fever. °? A rash. °? Little or no interest in activities you used to enjoy. °? Questions about caring for yourself or your baby. °· Your episiotomy or vaginal tear feels warm to the touch. °· Your episiotomy or vaginal tear is separating or does not appear to be healing. °· Your breasts are painful, hard, or turn red. °· You feel unusually sad or worried. °· You feel nauseous or you vomit. °· You pass large blood clots from your vagina. If you pass a blood clot from your vagina, save it to show to your health care provider. Do not flush blood clots down the toilet without having your health care provider look at them. °· You urinate more than usual. °· You are dizzy or light-headed. °· You have not breastfed at all and you have not had a menstrual period for 12 weeks after delivery. °· You have stopped breastfeeding and you have not had a menstrual period for 12 weeks after you stopped breastfeeding. °Get help right away if: °· You have: °? Pain that does not go away or does not get better with medicine. °? Chest pain. °? Difficulty breathing. °? Blurred vision or spots in your vision. °? Thoughts about hurting yourself or your baby. °· You develop pain in your abdomen or in one of your legs. °· You develop a severe headache. °· You faint. °· You bleed from your vagina so much that you fill two sanitary pads in one hour. °This information is not intended to replace advice given to you by your health care provider. Make sure you discuss any questions you have with your health care provider. °Document Released: 02/13/2000 Document Revised: 07/30/2015 Document Reviewed: 03/02/2015 °Elsevier Interactive Patient Education © 2018 [employer] ° °

## 2017-05-03 ENCOUNTER — Encounter (HOSPITAL_COMMUNITY): Payer: Self-pay | Admitting: *Deleted

## 2017-05-04 ENCOUNTER — Encounter: Payer: Self-pay | Admitting: Medical

## 2017-05-11 ENCOUNTER — Encounter: Payer: Self-pay | Admitting: Obstetrics and Gynecology

## 2017-05-11 ENCOUNTER — Ambulatory Visit: Payer: Medicaid Other | Admitting: General Practice

## 2017-05-11 VITALS — BP 133/87 | HR 73 | Ht 62.0 in | Wt 222.0 lb

## 2017-05-11 DIAGNOSIS — O360120 Maternal care for anti-D [Rh] antibodies, second trimester, not applicable or unspecified: Secondary | ICD-10-CM

## 2017-05-11 DIAGNOSIS — Z0130 Encounter for examination of blood pressure without abnormal findings: Secondary | ICD-10-CM

## 2017-05-11 NOTE — Progress Notes (Addendum)
Patient here BP check today. Patient denies blurry vision, dizziness, or swelling. Patient reports occasional mild headaches but attributes that to not sleeping much with the new baby and not drinking enough water. Patient is not on BP meds. Per Dr Debroah LoopArnold, BP is stable today- patient can follow up at scheduled pp visit. Discussed with patient & precautions given. Patient had no questions  Agree with the above note  Adam PhenixArnold, James G, MD

## 2017-05-12 LAB — ANTIBODY SCREEN

## 2017-05-12 LAB — AB SCR+ANTIBODY ID: Antibody Screen: POSITIVE — AB

## 2017-05-18 ENCOUNTER — Encounter: Payer: Self-pay | Admitting: Obstetrics & Gynecology

## 2017-06-08 ENCOUNTER — Other Ambulatory Visit: Payer: Self-pay | Admitting: Advanced Practice Midwife

## 2017-06-08 ENCOUNTER — Ambulatory Visit (INDEPENDENT_AMBULATORY_CARE_PROVIDER_SITE_OTHER): Payer: Self-pay | Admitting: Clinical

## 2017-06-08 ENCOUNTER — Ambulatory Visit (INDEPENDENT_AMBULATORY_CARE_PROVIDER_SITE_OTHER): Payer: Medicaid Other | Admitting: Advanced Practice Midwife

## 2017-06-08 VITALS — BP 130/93 | HR 82 | Wt 217.7 lb

## 2017-06-08 DIAGNOSIS — Z3202 Encounter for pregnancy test, result negative: Secondary | ICD-10-CM | POA: Diagnosis present

## 2017-06-08 DIAGNOSIS — F53 Postpartum depression: Secondary | ICD-10-CM

## 2017-06-08 DIAGNOSIS — Z1389 Encounter for screening for other disorder: Secondary | ICD-10-CM | POA: Diagnosis not present

## 2017-06-08 DIAGNOSIS — Z30430 Encounter for insertion of intrauterine contraceptive device: Secondary | ICD-10-CM

## 2017-06-08 DIAGNOSIS — O99345 Other mental disorders complicating the puerperium: Secondary | ICD-10-CM | POA: Diagnosis not present

## 2017-06-08 DIAGNOSIS — F322 Major depressive disorder, single episode, severe without psychotic features: Secondary | ICD-10-CM

## 2017-06-08 LAB — POCT PREGNANCY, URINE: PREG TEST UR: NEGATIVE

## 2017-06-08 MED ORDER — LEVONORGESTREL 19.5 MCG/DAY IU IUD
INTRAUTERINE_SYSTEM | Freq: Once | INTRAUTERINE | Status: AC
Start: 2017-06-08 — End: 2017-06-08
  Administered 2017-06-08: 15:00:00 via INTRAUTERINE

## 2017-06-08 MED ORDER — IBUPROFEN 600 MG PO TABS: 600.0000 mg | ORAL_TABLET | Freq: Four times a day (QID) | ORAL | 1 refills | Status: DC

## 2017-06-08 NOTE — Progress Notes (Signed)
Subjective:     Tami [name] is a 37 y.o. female who presents for a postpartum visit. She is 5 weeks postpartum following a spontaneous vaginal delivery. I have fully reviewed the prenatal and intrapartum course. The delivery was at 38 gestational weeks. Outcome: spontaneous vaginal delivery. Anesthesia: none. Postpartum course has been uneventful. Baby's course has been uneventful. Baby is feeding by breast. Bleeding no bleeding. Bowel function is normal. Bladder function is normal. Patient is not sexually active. Contraception method is IUD. Postpartum depression screening: positive.   Patient reports feeling depressed most days and is struggling to take care of herself and her newborn.  The following portions of the patient's history were reviewed and updated as appropriate: allergies, current medications, past family history, past medical history, past social history, past surgical history and problem list.  Review of Systems Pertinent items noted in HPI and remainder of comprehensive ROS otherwise negative.   Objective:    BP 116/88   Pulse 81   Wt 217 lb 11.2 oz (98.7 kg)   LMP 07/26/2016 (Approximate)   BMI 39.82 kg/m   General:  alert, cooperative and no distress   Breasts:  inspection negative, no nipple discharge or bleeding, no masses or nodularity palpable  Lungs: clear to auscultation bilaterally  Heart:  regular rate and rhythm, S1, S2 normal, no murmur, click, rub or gallop  Abdomen: soft, non-tender; bowel sounds normal; no masses,  no organomegaly   Vulva:  normal  Vagina: normal vagina, no discharge, exudate, lesion, or erythema  Cervix:  multiparous appearance, no cervical motion tenderness and no lesions  Corpus: normal size, contour, position, consistency, mobility, non-tender  Adnexa:  normal adnexa  Rectal Exam: Not performed.        Assessment:     Normal postpartum exam. Pap smear not done at today's visit. Last pap 05/2015, negative     Patient to  see behavioral health today after visit. Discussed with patient starting Zoloft today and patient unsure if she   desires to start.   Plan:    1. Contraception: IUD 2. Follow up in: 4 weeks for a string check or as needed.    Rolm BookbinderCaroline M Rudy Luhmann, CNM 06/08/17 10:58 AM

## 2017-06-08 NOTE — BH Specialist Note (Signed)
Integrated Behavioral Health Follow Up Visit  MRN: [id number] Name: Tami [name]  Number of Integrated Behavioral Health Clinician visits: 2/6 Session Start time: 11:30  Session End time: 12:00 Total time: 30 minutes  Type of Service: Integrated Behavioral Health- Individual/Family Interpretor:No. Interpretor Name and Language: n/a  SUBJECTIVE: Tami [name] is a 37 y.o. female accompanied by n/a Patient was referred by Dorathy Kinsman, CNM for depression. Patient reports the following symptoms/concerns: Pt's primary concern today is fatigue, depression, sleeplessness, and feeling overwhelmed with adjusting to being a single mother. Pt says she has had thoughts of "not being here anymore", because of extreme fatigue, but denies any intent or plan. She pushes through her initial sleepiness to "get chores done", and is struggling with the balance between sleeping and time for chores.  Duration of problem: Postpartum; Severity of problem: severe  OBJECTIVE: Mood: Depressed and Affect: Depressed Risk of harm to self or others: Suicidal ideation No plan to harm self or others  LIFE CONTEXT: Family and Social: Pt lives with her children (12, 10, 4, NB); family and friends supportive. Pt divorced [name] in past year.  School/Work: Will return to work on Friday Self-Care: Has stopped using self-care activities postpartum Life Changes: Recent childbirth; divorce [name] within past year  GOALS ADDRESSED: Patient will: 1.  Reduce symptoms of: anxiety, depression and stress  2.  Increase knowledge and/or ability of: coping skills and stress reduction  3.  Demonstrate ability to: Increase healthy adjustment to current life circumstances  INTERVENTIONS: Interventions utilized:  Solution-Focused Strategies, Sleep Hygiene and Psychoeducation and/or Health Education Standardized Assessments completed: Edinburgh Postnatal Depression  ASSESSMENT: Patient currently experiencing Major  depressive disorder, single episode, severe, without psychotic features   Patient may benefit from psychoeducation and brief therapeutic interventions regarding coping with symptoms of depression.  PLAN: 1. Follow up with behavioral health clinician on : One day via phone; office visit at next medical appointment 2. Behavioral recommendations:  -Follow Safety Plan, if SI returns -Sleep when baby sleeps tonight (ex. If tired at 7pm, go to sleep then, instead of chores first). Continue this every night until next medical appointment. -Take 20 minute nap at beginning of baby's morning naptime, before daily chores -Consider setting healthy boundaries with children and [name]  3. Referral(s): Integrated Behavioral Health Services (In Clinic) 4. "From scale of 1-10, how likely are you to follow plan?": 7  Rae Lips, LCSW   Edinburgh Postnatal Depression Scale - 06/08/17 1013      Edinburgh Postnatal Depression Scale:  In the Past 7 Days   I have been able to laugh and see the funny side of things.  1    I have looked forward with enjoyment to things.  3    I have blamed myself unnecessarily when things went wrong.  3    I have been anxious or worried for no good reason.  1    I have felt scared or panicky for no good reason.  1    Things have been getting on top of me.  3    I have been so unhappy that I have had difficulty sleeping.  2    I have felt sad or miserable.  3    I have been so unhappy that I have been crying.  3    The thought of harming myself has occurred to me.  2    Edinburgh Postnatal Depression Scale Total  22        [facility]  [facility] 04/21/2017 03/08/2017 02/09/2017 01/19/2017 12/28/2016  Decreased Interest 1 1 1 1 1   Down, Depressed, Hopeless 1 0 1 1 2   PHQ - 2 Score 2 1 2 2 3   Altered sleeping 1 2 2  0 1  Tired, decreased energy 3 1 1 1  0  Change in appetite 0 1 0 0 0  Feeling bad or failure about yourself  0 0 1 1 2   Trouble concentrating 0 0 0 0 0   Moving slowly or fidgety/restless 0 1 0 0 0  Suicidal thoughts 0 0 0 0 1  PHQ-9 Score 6 6 6 4 7    GAD 7 : Generalized Anxiety Score 04/21/2017 03/08/2017 02/09/2017 01/19/2017  Nervous, Anxious, on Edge 0 0 0 0  Control/stop worrying 0 1 0 0  Worry too much - different things 0 1 1 1   Trouble relaxing 1 1 1  0  Restless 0 0 0 0  Easily annoyed or irritable 2 1 1  0  Afraid - awful might happen 0 0 0 0  Total GAD 7 Score 3 4 3  1

## 2017-06-08 NOTE — Progress Notes (Signed)
Patient identified, informed consent performed, signed copy in chart, time out was performed.  Urine pregnancy test negative. No IC since before delivery.   Large Graves speculum placed in the vagina.  Cervix anterior, visualized.  Cleaned with Betadine x 2.  Uterus sounded to 8 cm.  Liletta IUD placed per manufacturer's recommendations.  Strings trimmed to 3 cm. Pt tolerated procedure well. Had episode of dizziness following placement. HOB lowered and pt given water. Pt feeling better.   Patient given post procedure instructions and IUD care card with expiration date.  Patient is asked to check IUD strings periodically and follow up in 4-6 weeks for IUD check

## 2017-06-08 NOTE — Patient Instructions (Signed)

## 2017-06-09 ENCOUNTER — Telehealth: Payer: Self-pay | Admitting: Clinical

## 2017-06-09 NOTE — Telephone Encounter (Signed)
Pt says "I took your advice last night, went ahead and slept, and let everything else go" for the night. Pt plans to continue prioritizing her own need for sleep tonight, as she goes back to work on Friday(06-10-17), [facility] Pt is aware that if she needs to be seen prior to that time, she may walk-in or schedule an appointment.

## 2017-06-14 ENCOUNTER — Encounter: Payer: Self-pay | Admitting: *Deleted

## 2017-07-12 NOTE — BH Specialist Note (Signed)
Integrated Behavioral Health Follow Up Visit  MRN: [id number] Name: Tami [name]  Number of Integrated Behavioral Health Clinician visits: 3/6 Session Start time: 10:38 Session End time: 11:09 Total time: 30 minutes  Type of Service: Integrated Behavioral Health- Individual/Family Interpretor:No. Interpretor Name and Language: n/a  SUBJECTIVE: Tami [name] is a 37 y.o. female accompanied by NB daughter Patient was referred by [place] for depression. Patient reports the following symptoms/concerns: Pt states she feels she is coping better the past month, after coming to accept that she cannot do everything herself, allowing her children and family members to help with appropriate household chores, accepting imperfection as the current normal, and prioritizing daily self-care.  Duration of problem: Postpartum; Severity of problem: mild  OBJECTIVE: Mood: Normal and Affect: Appropriate Risk of harm to self or others: No plan to harm self or others  LIFE CONTEXT: Family and Social: Pt lives with her children(12yo, 20yo, 5yo; NB), family and friends are supportive; pt divorced [name] in the past year  School/Work: Has decided to wait to return to work; unpredictable child support soon Self-Care: Changing mindset for increase in self-care Life Changes: Childbirth and divorce  GOALS ADDRESSED: Patient will: 1.  Reduce  symptoms of: anxiety, depression and stress  2.  Increase knowledge and/or ability of: self-management skills  3.  Demonstrate ability to: Increase healthy adjustment to current life circumstances    INTERVENTIONS: Interventions utilized:  Solution-Focused Strategies Standardized Assessments completed: GAD-7 and PHQ 9  ASSESSMENT: Patient currently experiencing Major depressive disorder, single episode, severe, without psychotic features   Patient may benefit from continued brief therapeutic interventions regarding coping with symptoms of anxiety and  depression.  PLAN: 1. Follow up with behavioral health clinician on : As requested by pt 2. Behavioral recommendations:  -Continue to follow Safety Plan, if SI returns - Continue to prioritize daily self-care, including dividing up household responsibilities with family members - Research potential master's programs within the next month, for future planning 3. Referral(s): Integrated Behavioral Health Services (In Clinic) 4. "From scale of 1-10, how likely are you to follow plan?": 10  Rae Lips, LCSW   Edinburgh Postnatal Depression Scale - 06/08/17 1013      Edinburgh Postnatal Depression Scale:  In the Past 7 Days   I have been able to laugh and see the funny side of things.  1    I have looked forward with enjoyment to things.  3    I have blamed myself unnecessarily when things went wrong.  3    I have been anxious or worried for no good reason.  1    I have felt scared or panicky for no good reason.  1    Things have been getting on top of me.  3    I have been so unhappy that I have had difficulty sleeping.  2    I have felt sad or miserable.  3    I have been so unhappy that I have been crying.  3    The thought of harming myself has occurred to me.  2    Edinburgh Postnatal Depression Scale Total  22       Depression screen [facility] 2/9 07/13/2017 04/21/2017 03/08/2017 02/09/2017 01/19/2017  Decreased Interest Down, Depressed, Hopeless 1 1 0 1 1  PHQ - 2 Score Altered sleeping 0 0  Tired, decreased energy 2 3 1  1 1  Change in appetite 0 0 1 0 0  Feeling bad or failure about yourself  3 0 0 1 1  Trouble concentrating 1 0 0 0 0  Moving slowly or fidgety/restless 0 0 1 0 0  Suicidal thoughts 1 0 0 0 0  PHQ-9 Score GAD 7 : Generalized Anxiety Score 07/13/2017 04/21/2017 03/08/2017 02/09/2017  Nervous, Anxious, on Edge 2 0 0 0  Control/stop worrying 2 0 1 0  Worry too much - different things 3 0 1 1  Trouble relaxing Restless 1 0 0 0  Easily annoyed or irritable Afraid - awful might happen 1 0 0 0  Total GAD 7 Score 3

## 2017-07-13 ENCOUNTER — Ambulatory Visit (INDEPENDENT_AMBULATORY_CARE_PROVIDER_SITE_OTHER): Payer: Medicaid Other | Admitting: Clinical

## 2017-07-13 ENCOUNTER — Ambulatory Visit (INDEPENDENT_AMBULATORY_CARE_PROVIDER_SITE_OTHER): Payer: Medicaid Other | Admitting: Advanced Practice Midwife

## 2017-07-13 ENCOUNTER — Encounter: Payer: Self-pay | Admitting: Advanced Practice Midwife

## 2017-07-13 VITALS — BP 133/85 | HR 101 | Ht 62.0 in | Wt 222.1 lb

## 2017-07-13 DIAGNOSIS — Z975 Presence of (intrauterine) contraceptive device: Secondary | ICD-10-CM

## 2017-07-13 DIAGNOSIS — F322 Major depressive disorder, single episode, severe without psychotic features: Secondary | ICD-10-CM

## 2017-07-13 DIAGNOSIS — Z30431 Encounter for routine checking of intrauterine contraceptive device: Secondary | ICD-10-CM | POA: Diagnosis not present

## 2017-07-13 NOTE — Progress Notes (Signed)
  GYNECOLOGY CLINIC PROGRESS NOTE  History:  37 y.o. [id number] here today for today for IUD string check; Liletta IUD was placed at her PP visit on 06/08/17. No complaints about the Liletta, no concerning side effects.  The following portions of the patient's history were reviewed and updated as appropriate: allergies, current medications, past family history, past medical history, past social history, past surgical history and problem list. Last pap smear on 06/25/2015 was normal, negative HRHPV.  Review of Systems:  Pertinent items are noted in HPI.   Objective:  Physical Exam Blood pressure 133/85, pulse (!) 101, height  (1.575 m), weight 222 lb 1.6 oz (100.7 kg), unknown if currently breastfeeding. Gen: NAD Abd: Soft, nontender and nondistended Pelvic: Normal appearing external genitalia; normal appearing vaginal mucosa and cervix.  IUD strings visualized, about 3 cm in length outside cervix.   Assessment & Plan:  Normal IUD check. Patient to keep IUD in place for five years; can come in for removal if she desires pregnancy within the next five years. Routine preventative health maintenance measures emphasized.  Sharen Counter, CNM 10:30 AM

## 2017-07-13 NOTE — Patient Instructions (Signed)

## 2017-07-13 NOTE — Progress Notes (Signed)
Here for iud check, c/o spotting only.

## 2018-01-16 IMAGING — US US OB COMP LESS 14 WK
1 series · 15 of 28 positions shown · non-contrast
Comparison: None for this gestation

CLINICAL DATA: Vaginal bleeding before 22 weeks gestation,
uncertain LMP, quantitative beta HCG pending

EXAM:
OBSTETRIC <14 WK US AND TRANSVAGINAL OB US
TECHNIQUE: Both transabdominal and transvaginal ultrasound examinations were
performed for complete evaluation of the gestation as well as the
maternal uterus, adnexal regions, and pelvic cul-de-sac.
Transvaginal technique was performed to assess early pregnancy.

[Series 1: us ob comp less 14 wk · 15 of 58 slices shown]
[im 1/58]
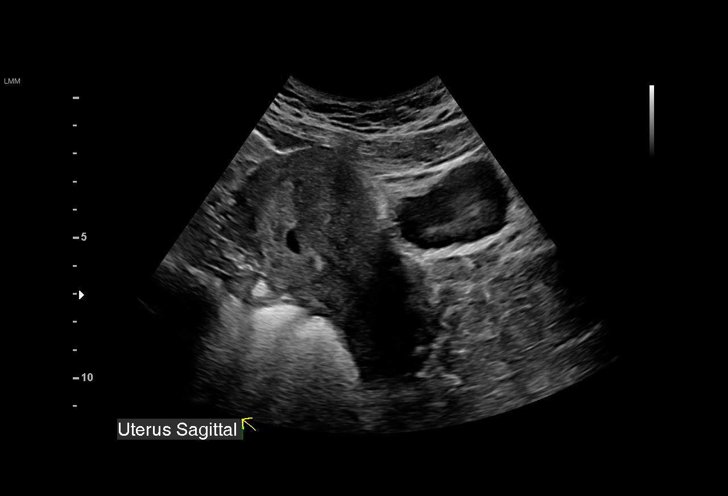
[im 5/58]
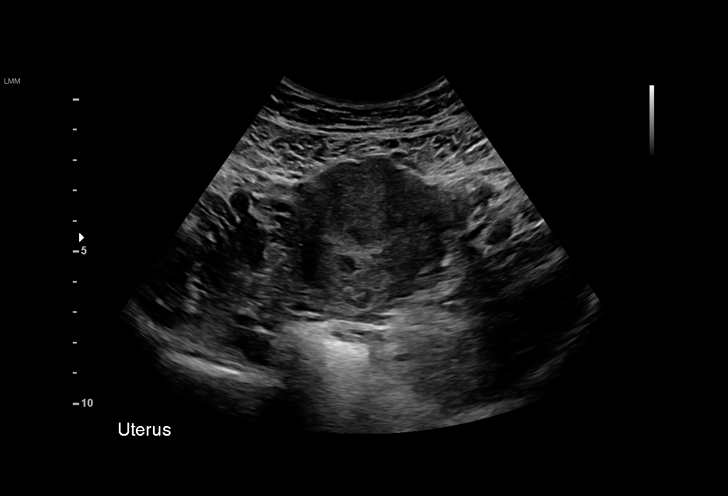
[im 9/58]
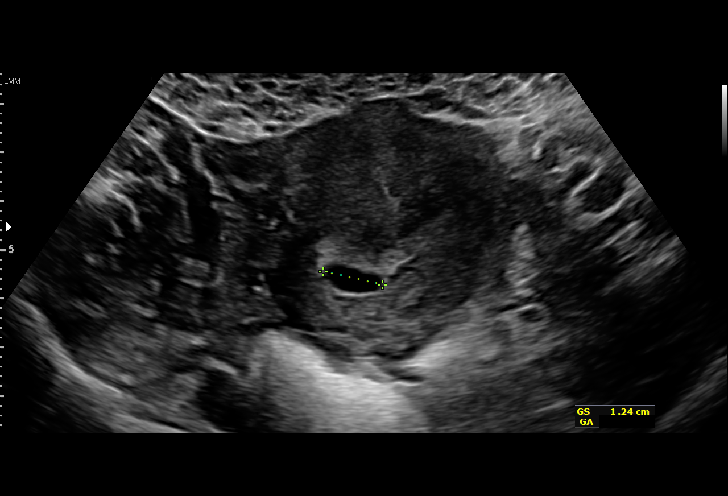
[im 13/58]
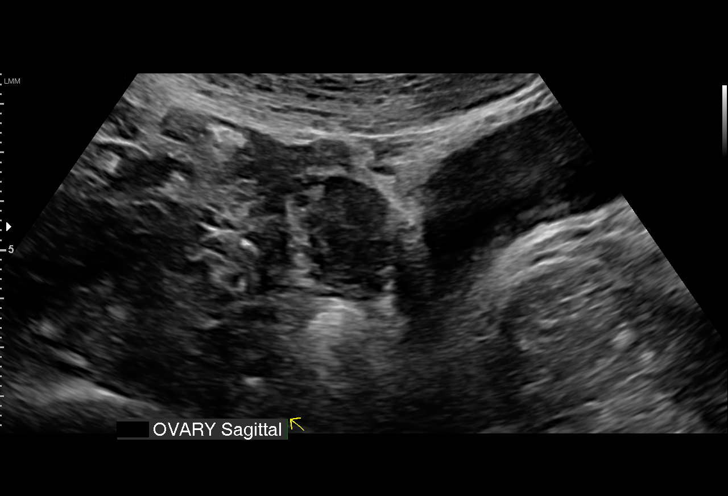
[im 17/58]
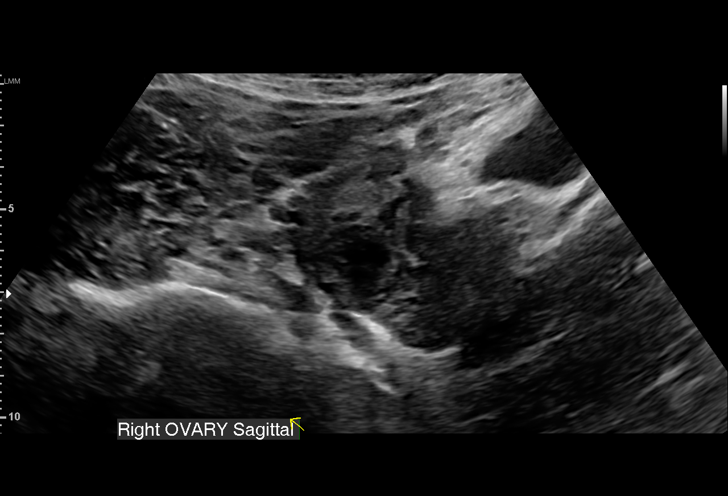
[im 22/58]
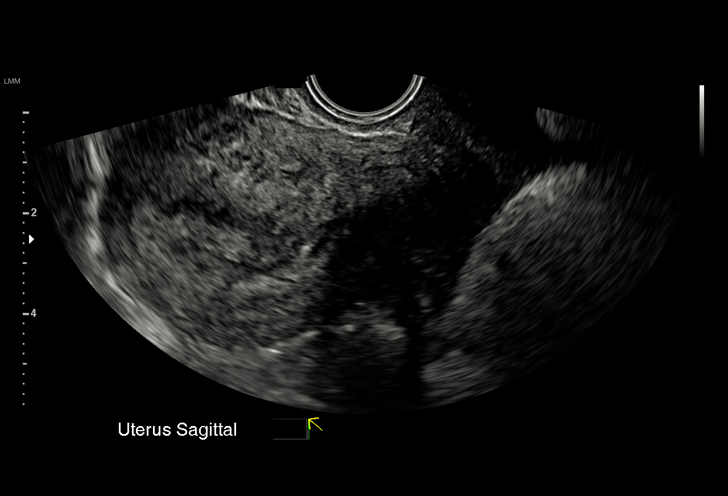
[im 26/58]
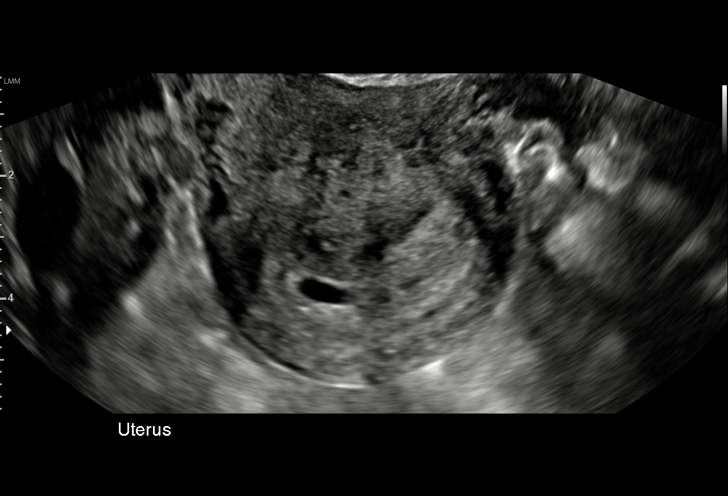
[im 30/58]
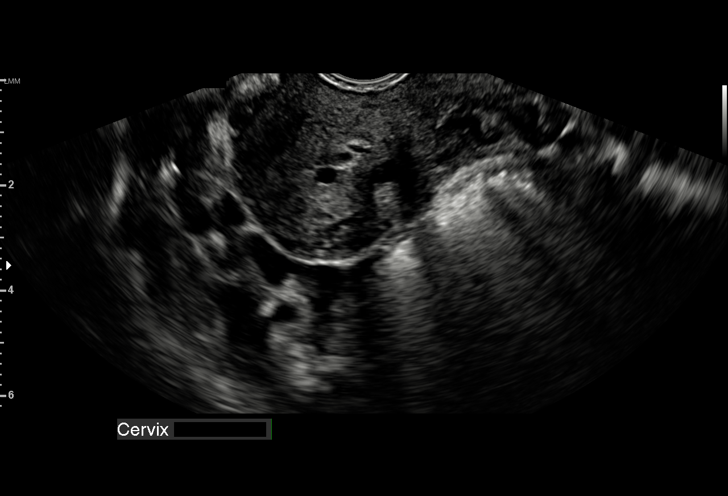
[im 32/58]
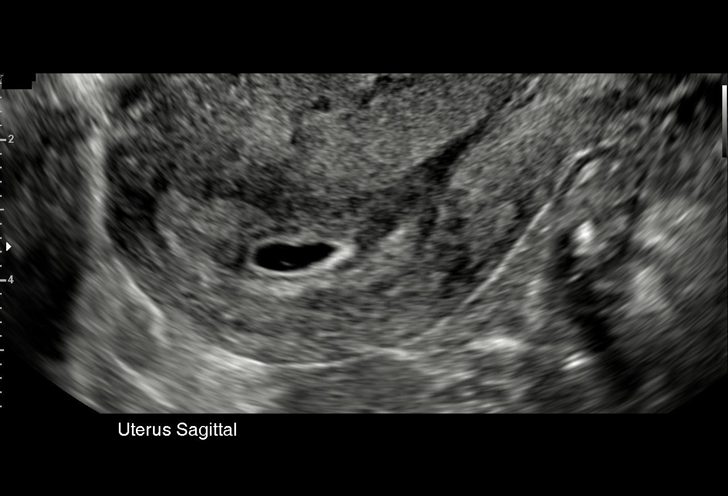
[im 36/58]
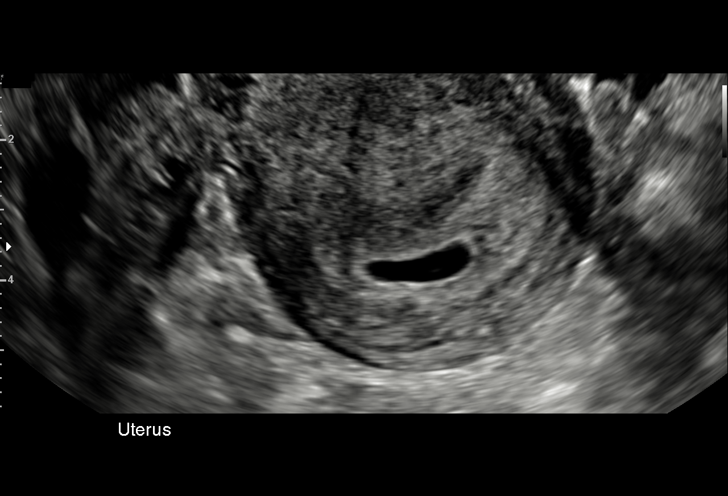
[im 41/58]
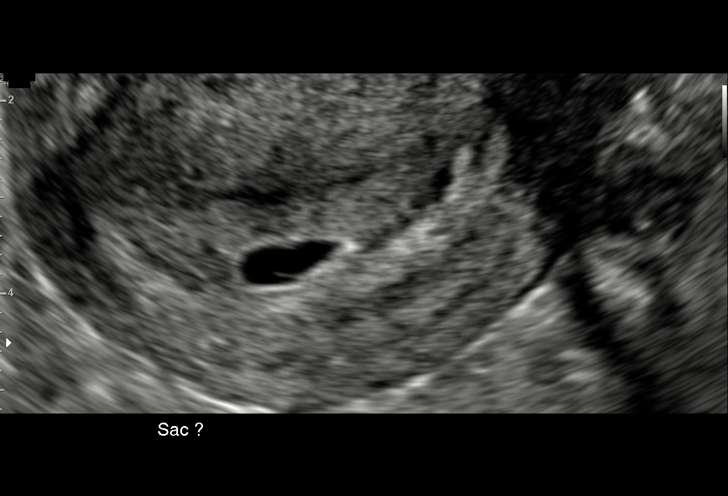
[im 45/58]
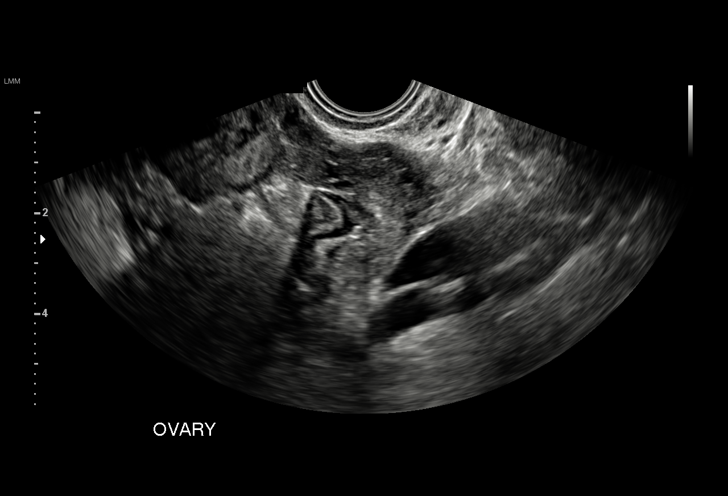
[im 49/58]
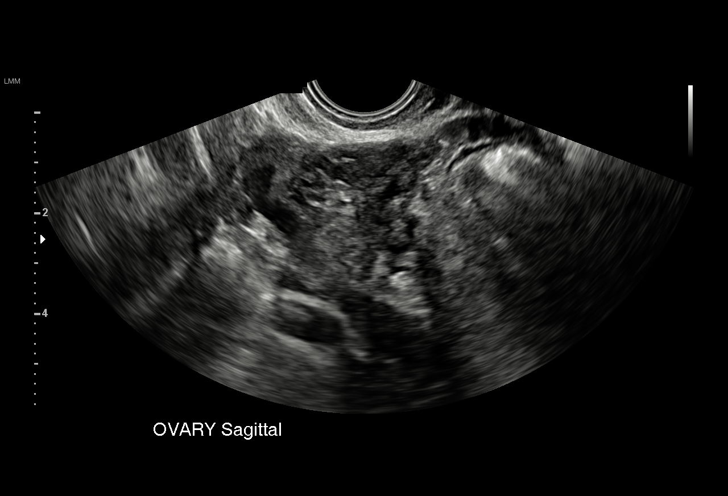
[im 53/58]
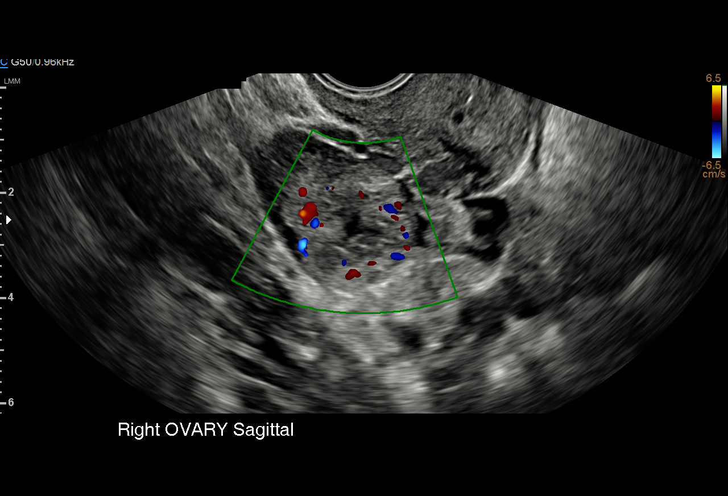
[im 58/58]
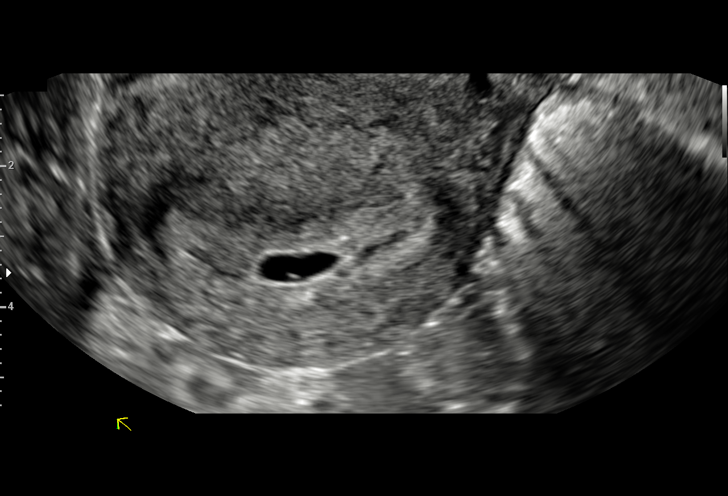

[15 of 28 positions shown; findings below may reference images not displayed]

FINDINGS: Intrauterine gestational sac: Present

Yolk sac:  Not definitely visualized

Embryo:  Absent

Cardiac Activity: N/A

Heart Rate: N/A  bpm

MSD: 11.38  mm   5 w   6  d

Subchorionic hemorrhage:  None visualized.

Maternal uterus/adnexae:

RIGHT ovary measures 3.3 x 3.6 x 2.4 cm and contains a corpus
luteum.

LEFT ovary normal size and morphology, 2.9 x 2.8 x 2.1 cm.

No free pelvic fluid or adnexal masses.
IMPRESSION: Gestational sac within the uterus without definite visualization of
a yolk sac or fetal pole.

No extrauterine abnormalities identified.

## 2018-01-19 IMAGING — US US OB TRANSVAGINAL
1 series · 15 of 27 positions shown · non-contrast
Comparison: 09/12/2016 obstetric scan.

CLINICAL DATA: 35-year-old pregnant female with inconclusive
viability presents with 1 day of severe pelvic pain. Quantitative
beta HCG [DATE].

EDC by LMP: 05/02/2017, projecting to an expected gestational age of
7 weeks 2 days.
EXAM:
TRANSVAGINAL OB ULTRASOUND
TECHNIQUE: Transvaginal ultrasound was performed for complete evaluation of the
gestation as well as the maternal uterus, adnexal regions, and
pelvic cul-de-sac.

[Series 1: us ob transvaginal · 27 acquisitions, 15 frames shown]
[im 1/27]
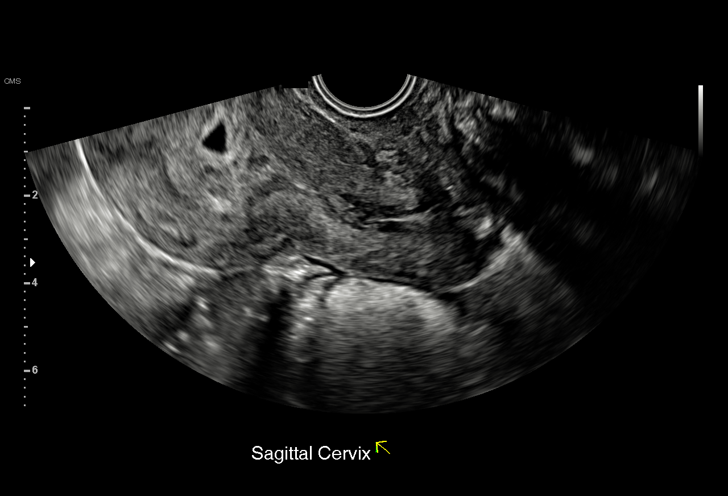
[im 3/27]
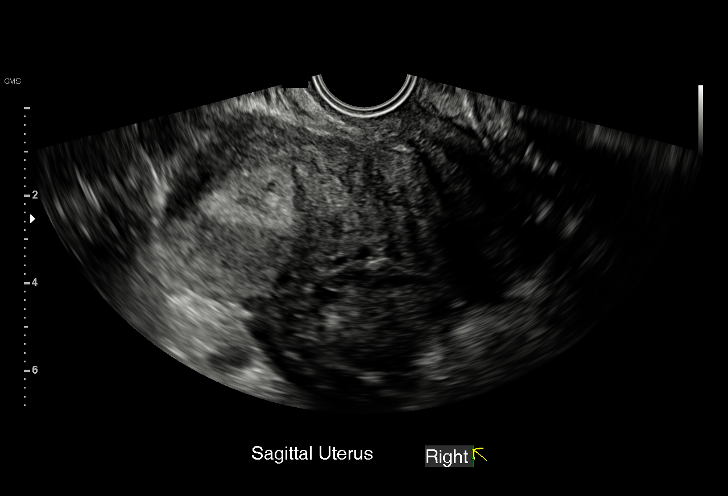
[im 5/27]
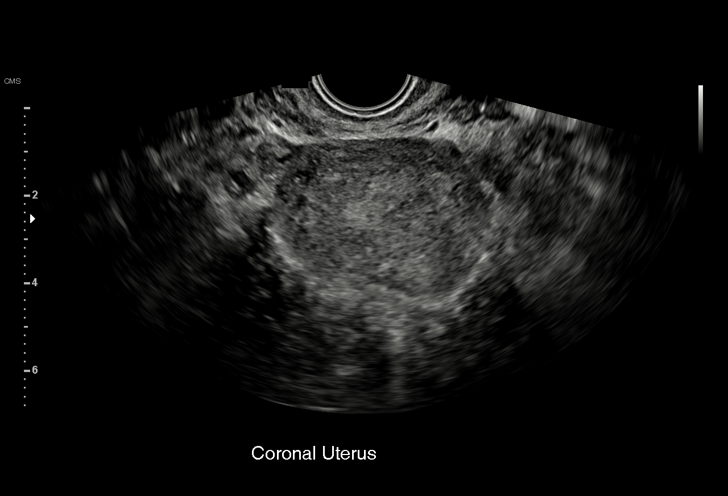
[im 7/27]
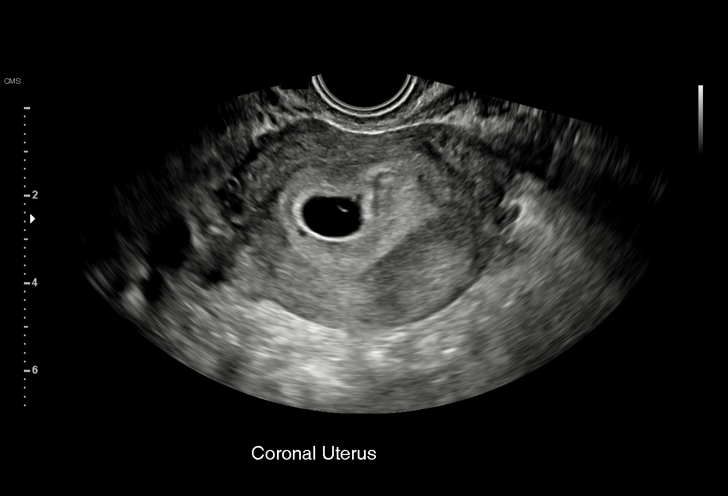
[im 9/27]
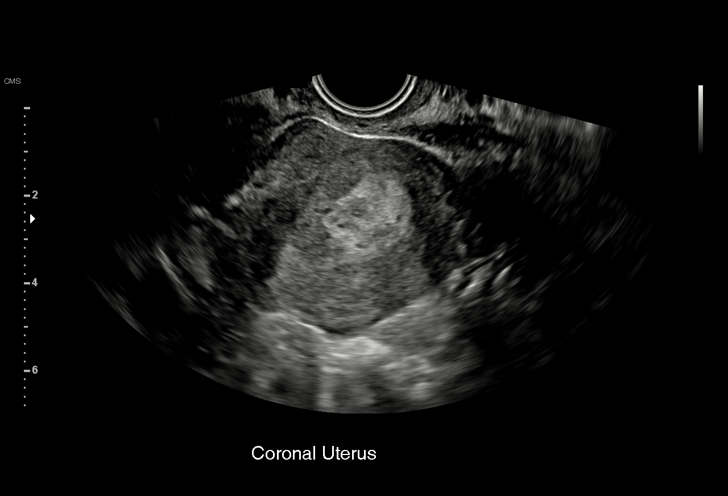
[im 10/27]
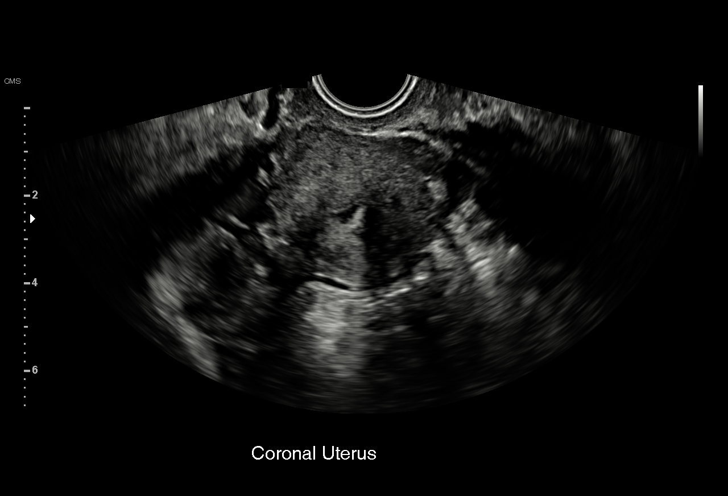
[im 12/27]
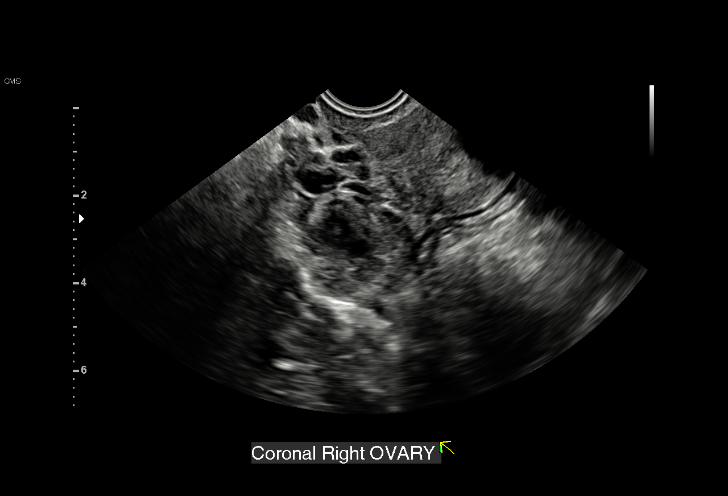
[im 14/27]
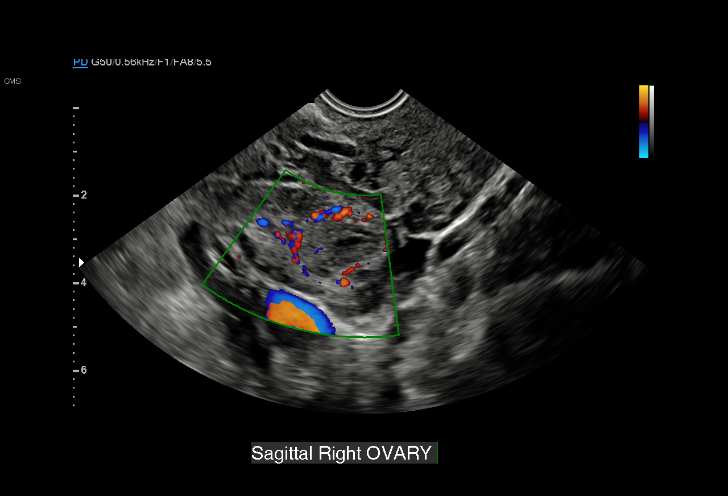
[im 16/27]
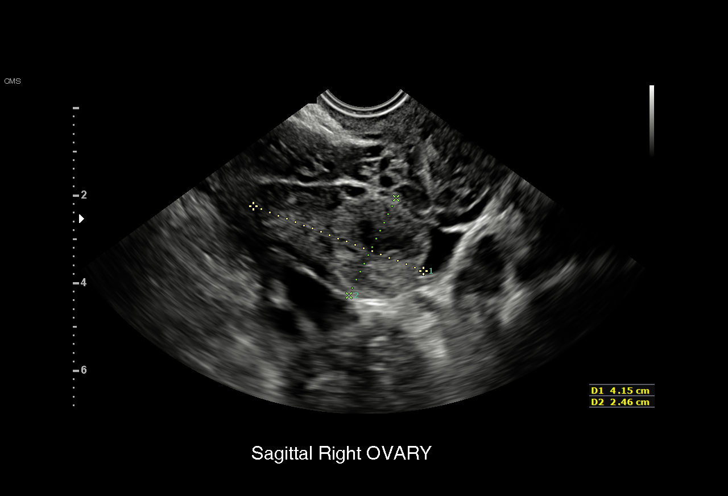
[im 18/27]
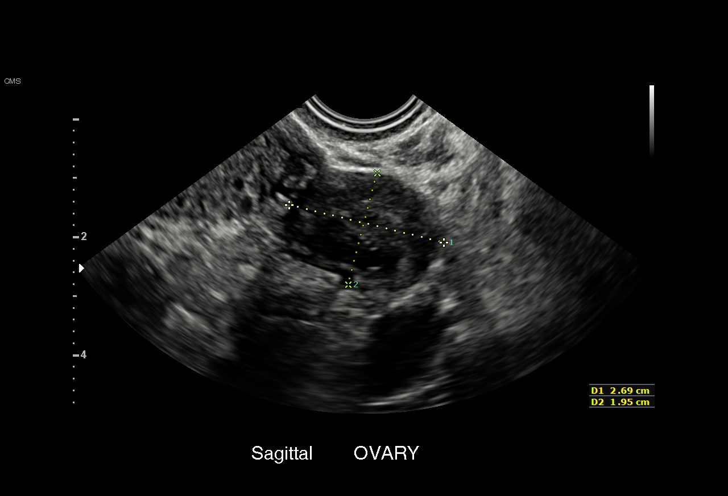
[im 19/27]
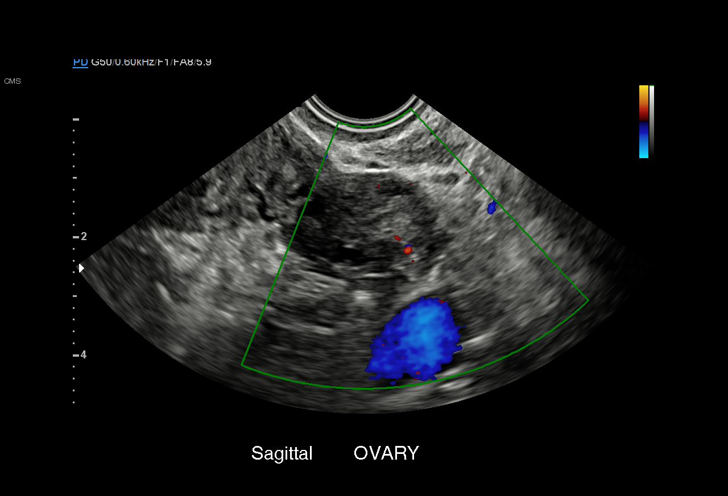
[im 21/27]
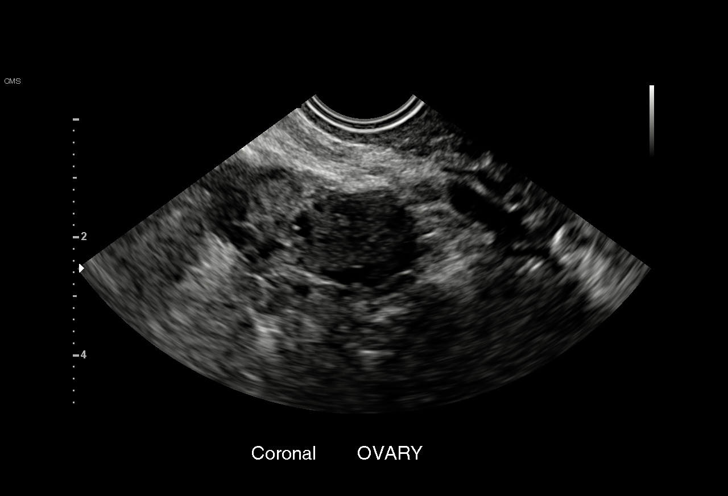
[im 23/27]
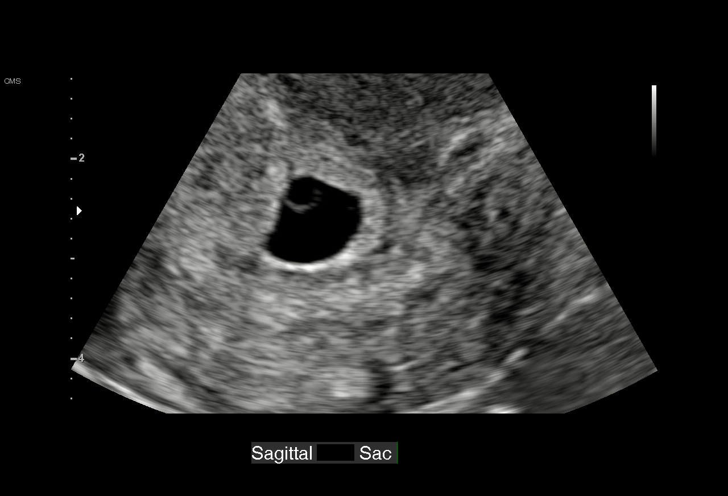
[im 25/27]
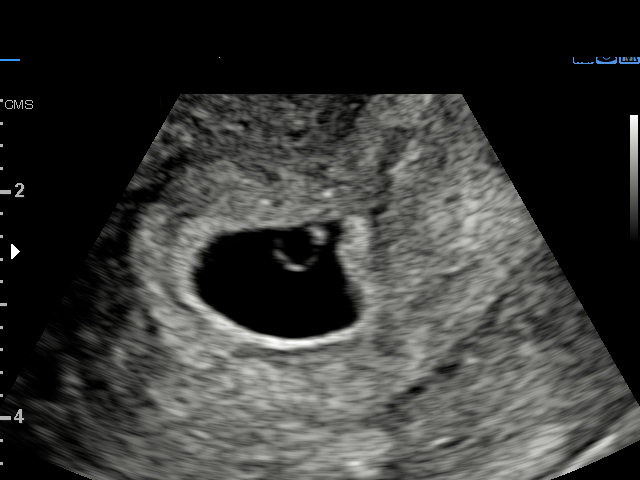
[im 27/27]
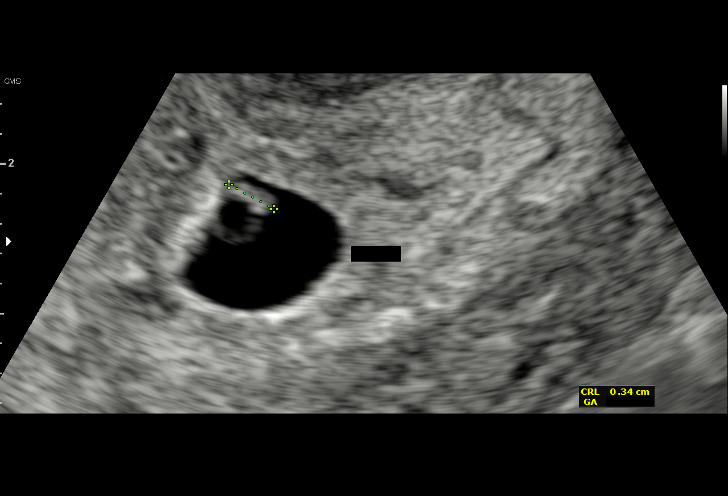

[15 of 27 positions shown; findings below may reference images not displayed]

FINDINGS: Intrauterine gestational sac: Single intrauterine gestational sac
appears normal in size, shape and position.

Yolk sac:  Visualized.

Embryo:  Visualized.

Embryonic Cardiac Activity: Visualized at real-time sonography and
on the provided cine sequence.

Embryonic Heart Rate: Unable to obtain M-mode scan due to tiny
embryo size and patient discomfort.

CRL:   3.1  mm   5 w 6 d                  US EDC: 05/12/2017

Subchorionic hemorrhage:  None visualized.

Maternal uterus/adnexae: Anteverted uterus with no uterine fibroids.
Right ovary measures 4.2 x 2.5 x 2.1 cm and contains a corpus
luteum. Left ovary measures 2.7 x 2.0 x 2.2 cm.
IMPRESSION: 1. Single living intrauterine gestation at 5 weeks 6 days by
crown-rump length, mildly discordant with the expected gestational
age of 7 weeks 2 days by provided menstrual dating. Embryonic
cardiac activity is visualized at real-time sonography, although the
embryonic heart rate could not be obtained due to tiny embryo size
and patient discomfort. Consider a follow-up short-term obstetric
scan in 4 weeks to assess for appropriate fetal growth.
2. No acute early first-trimester gestational abnormality.

## 2018-04-14 IMAGING — US US MFM OB DETAIL+14 WK
1 series · 14 of 28 positions shown · non-contrast
Comparison: none

[Series 1: us mfm ob detail+14 wk · 14 of 81 slices shown]
[im 3/81]
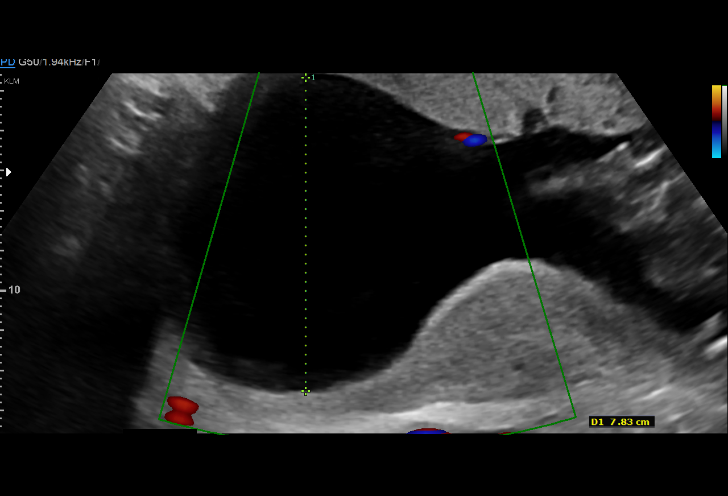
[im 9/81]
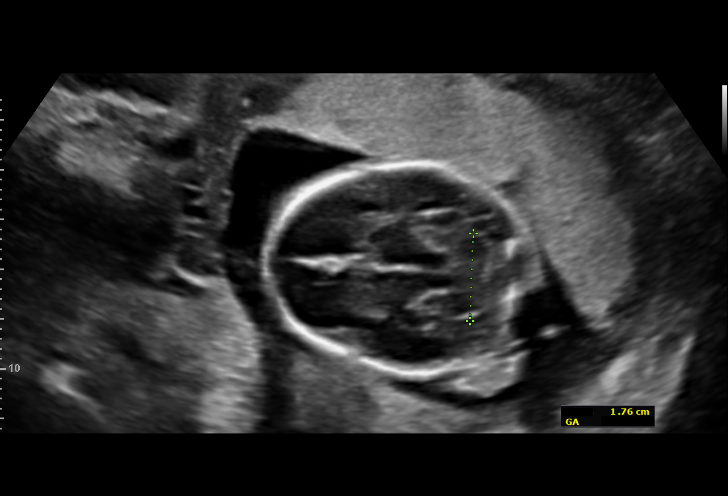
[im 15/81]
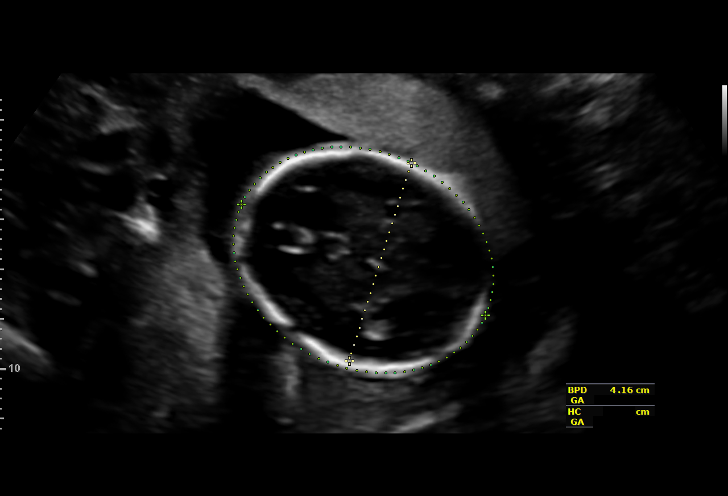
[im 21/81]
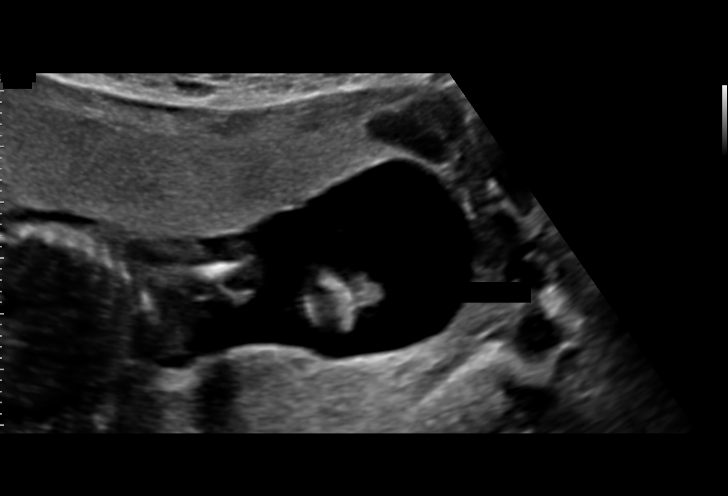
[im 27/81]
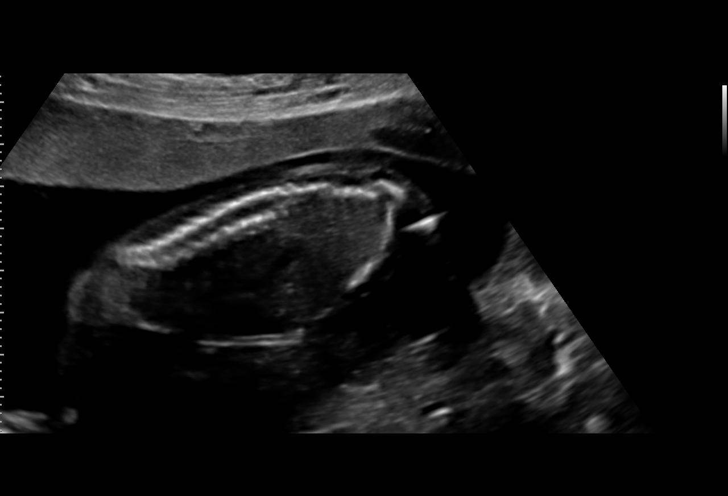
[im 33/81]
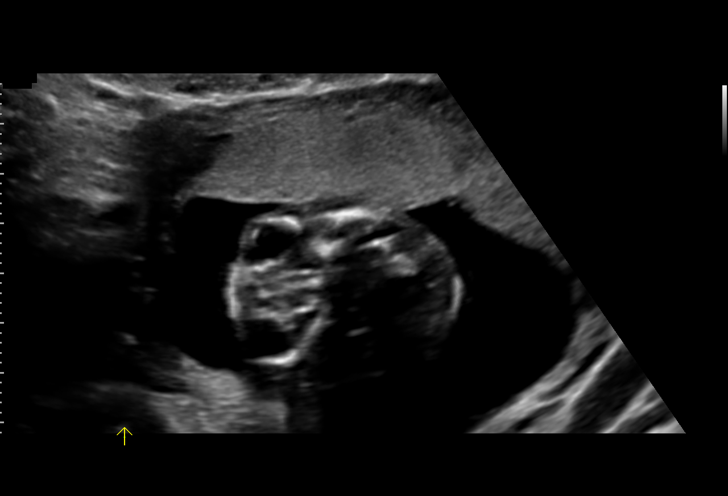
[im 39/81]
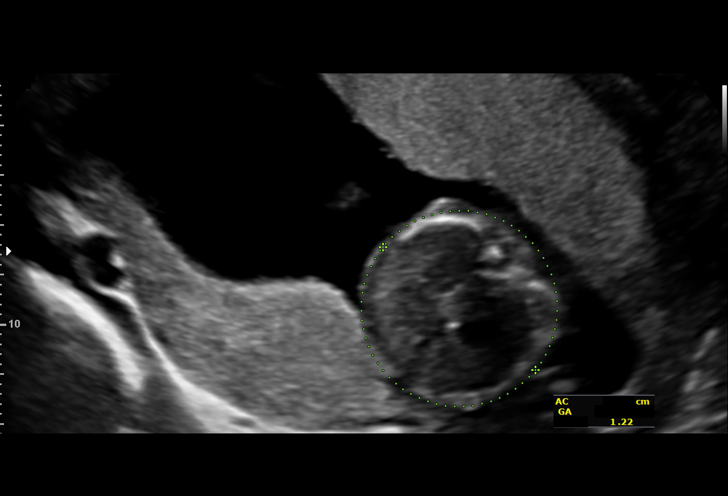
[im 45/81]
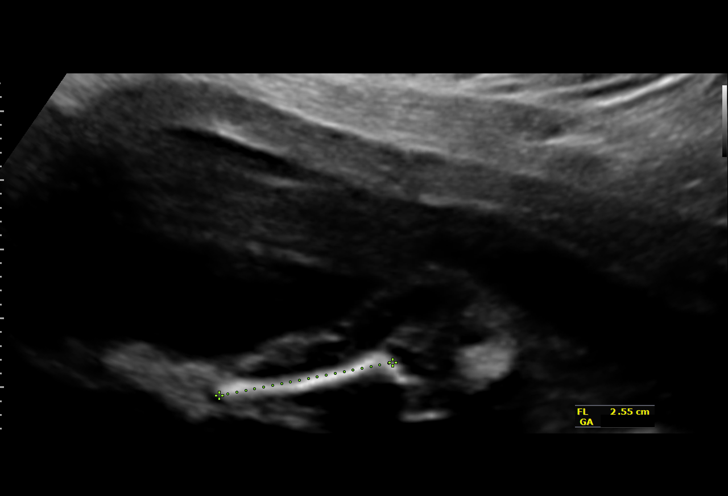
[im 51/81]
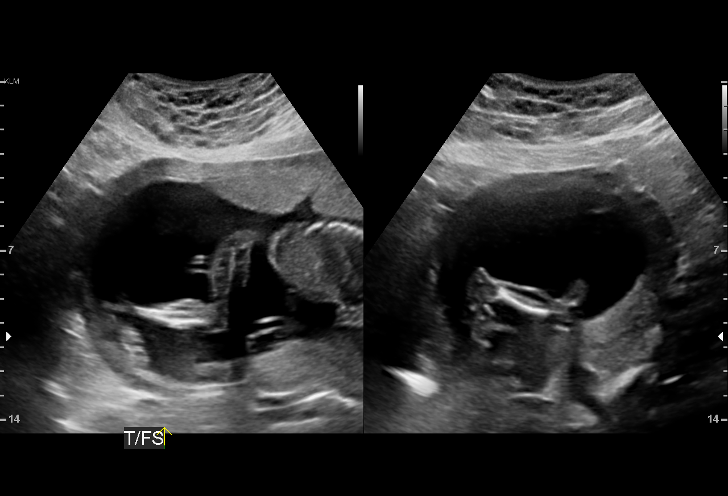
[im 57/81]
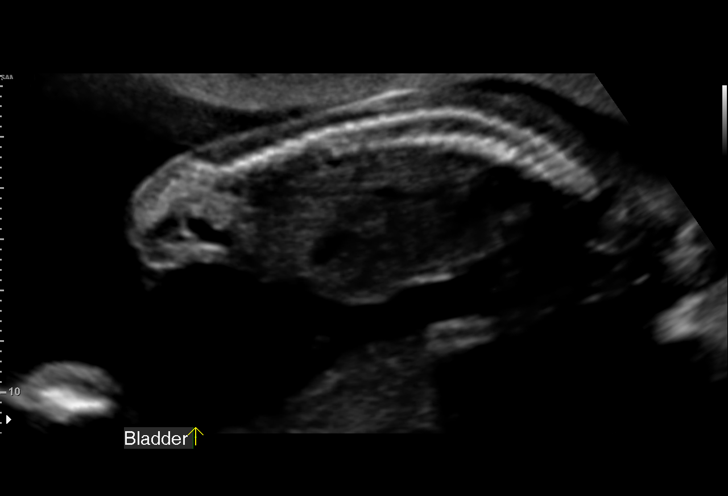
[im 63/81]
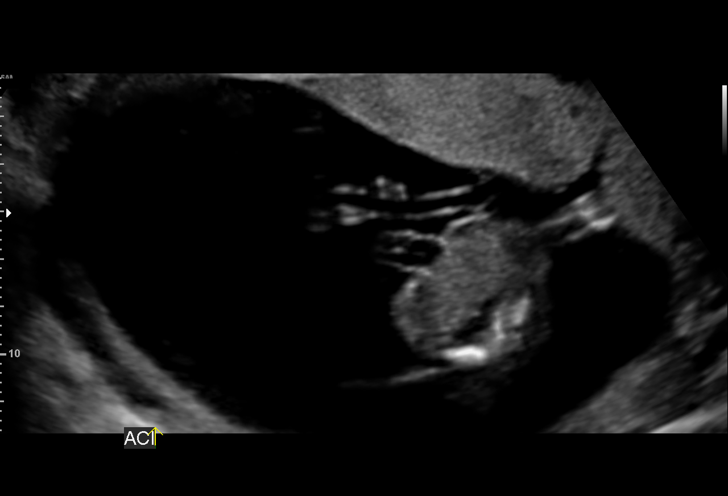
[im 69/81]
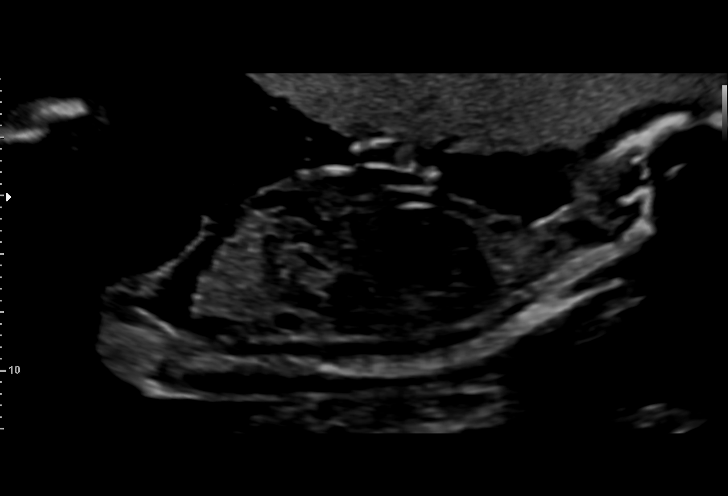
[im 75/81]
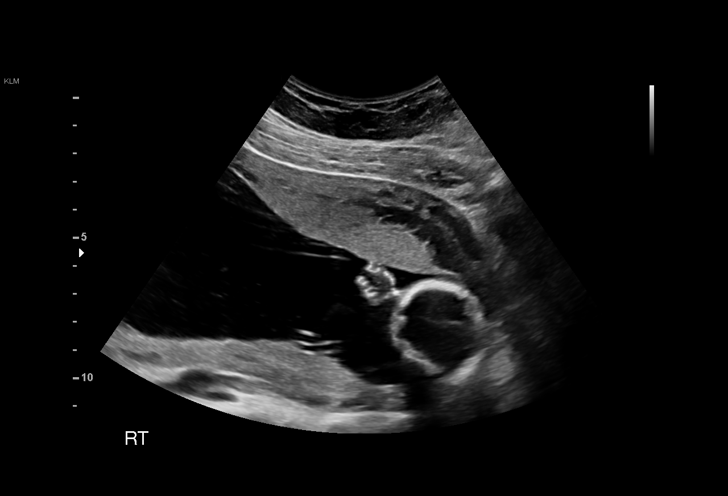
[im 81/81]
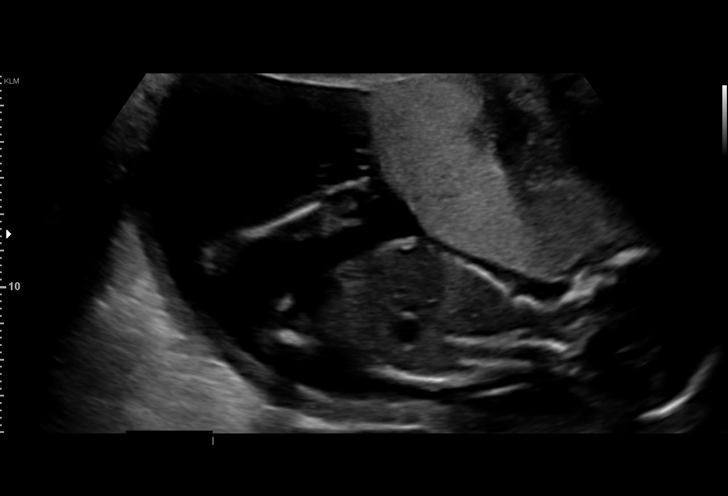

[14 of 28 positions shown; findings below may reference images not displayed]

1  GAMA ZHININ           [id number]      [id number]     [id number]
Indications

18 weeks gestation of pregnancy
Obesity complicating pregnancy, third
trimester (Prepreg BMI 33.8)
Encounter for fetal anatomic survey
Rh negative state in antepartum
Advanced maternal age multigravida 35+,
second trimester; ? Panorama drawn- result
not in [REDACTED]; low risk quad screen
OB History

Gravidity:    5         Term:   3        Prem:   0        SAB:   0
TOP:          1       Ectopic:  0        Living: 3
Fetal Evaluation

Num Of Fetuses:     1
Fetal Heart         149
Rate(bpm):
Cardiac Activity:   Observed
Presentation:       Cephalic
Placenta:           Anterior, above cervical os
P. Cord Insertion:  Visualized, central

Amniotic Fluid
AFI FV:      Subjectively within normal limits

Largest Pocket(cm)
7.8
Biometry
BPD:      41.6  mm     G. Age:  18w 4d         76  %    CI:        75.08   %    70 - 86
FL/HC:      16.9   %    15.8 - 18
HC:      152.3  mm     G. Age:  18w 2d         54  %    HC/AC:      1.19        1.07 -
AC:      128.4  mm     G. Age:  18w 3d         61  %    FL/BPD:     61.8   %
FL:       25.7  mm     G. Age:  17w 6d         38  %    FL/AC:      20.0   %    20 - 24
HUM:      24.8  mm     G. Age:  17w 6d         48  %
CER:      17.6  mm     G. Age:  17w 4d         41  %

Est. FW:     226  gm      0 lb 8 oz     52  %
Gestational Age

LMP:           19w 3d        Date:  07/26/16                 EDD:   05/02/17
U/S Today:     18w 2d                                        EDD:   05/10/17
Best:          18w 0d     Det. By:  Early Ultrasound         EDD:   05/12/17
(09/15/16)
Anatomy

Cranium:               Appears normal         Aortic Arch:            Appears normal
Cavum:                 Appears normal         Ductal Arch:            Appears normal
Ventricles:            Appears normal         Diaphragm:              Appears normal
Choroid Plexus:        Appears normal         Stomach:                Appears normal, left
sided
Cerebellum:            Appears normal         Abdomen:                Appears normal
Posterior Fossa:       Appears normal         Abdominal Wall:         Appears nml (cord
insert, abd wall)
Nuchal Fold:           Appears normal         Cord Vessels:           Appears normal (3
vessel cord)
Face:                  Appears normal         Kidneys:                Appear normal
(orbits and profile)
Lips:                  Appears normal         Bladder:                Appears normal
Thoracic:              Appears normal         Spine:                  Appears normal
Heart:                 Appears normal         Upper Extremities:      Appears normal
(4CH, axis, and situs
RVOT:                  Appears normal         Lower Extremities:      Appears normal
LVOT:                  Appears normal

Other:  Fetus appears to be a female. Heels and Left 5th digit visualized.
Nasal bone visualized.
Cervix Uterus Adnexa

Cervix
Length:           3.14  cm.
Normal appearance by transabdominal scan.
Impression

SIUP at 18+0 weeks
Normal detailed fetal anatomy
Markers of aneuploidy: none
Normal amniotic fluid volume
Measurements consistent with early US
Recommendations

Follow-up ultrasound for growth in third trimester (AMA)

## 2018-08-18 ENCOUNTER — Other Ambulatory Visit: Payer: Self-pay | Admitting: Family Medicine

## 2018-08-18 ENCOUNTER — Other Ambulatory Visit (HOSPITAL_COMMUNITY)
Admission: RE | Admit: 2018-08-18 | Discharge: 2018-08-18 | Disposition: A | Discharge status: Home / Self Care | Payer: Medicaid Other | Source: Ambulatory Visit | Attending: Family Medicine | Admitting: Family Medicine

## 2018-08-18 DIAGNOSIS — N898 Other specified noninflammatory disorders of vagina: Secondary | ICD-10-CM | POA: Insufficient documentation

## 2018-08-22 LAB — URINE CYTOLOGY ANCILLARY ONLY
Bacterial vaginitis: NEGATIVE
Candida vaginitis: POSITIVE — AB
Chlamydia: NEGATIVE
Neisseria Gonorrhea: NEGATIVE
Trichomonas: NEGATIVE

## 2023-04-19 NOTE — Progress Notes (Signed)
" °  Subjective Patient ID: Tami [name] is a 43 y.o. female.  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   Nasal Congestion    Started this am. Has not taken any medication at this time.    Cough    Productive with clear/yellow mucus production    The following information was reviewed by members of the visit team:  Tobacco  Allergies  Meds  Med Hx  Surg Hx  OB Status  Fam Hx  Soc  Hx     43 yo F who presents for evaluation of nasal congestion, cough, body aches and low grade fever. Symptoms began today. Cough has been mildly productive of sputum. No associated shortness of breath, nausea, vomiting or diarrhea. States she has been exposed to flu recently at work and is concerned for same.      Review of Systems  Constitutional:  Positive for chills and fever (subjective).  HENT:  Positive for congestion. Negative for ear pain, rhinorrhea, sinus pressure, sinus pain and sore throat.   Respiratory:  Positive for cough. Negative for chest tightness and shortness of breath.   Gastrointestinal:  Negative for abdominal pain, diarrhea, nausea and vomiting.  Musculoskeletal:  Positive for myalgias. Negative for arthralgias, neck pain and neck stiffness.  Skin:  Negative for rash.  Neurological:  Negative for dizziness and headaches.  Hematological:  Negative for adenopathy. Does not bruise/bleed easily.    Objective Physical Exam Vitals reviewed.  Constitutional:      General: She is not in acute distress.    Appearance: Normal appearance. She is not ill-appearing, toxic-appearing or diaphoretic.  HENT:     Right Ear: Tympanic membrane, ear canal and external ear normal.     Left Ear: Tympanic membrane, ear canal and external ear normal.     Nose: Nose normal. No rhinorrhea.     Mouth/Throat:     Mouth: Mucous membranes are moist.     Pharynx: Posterior  oropharyngeal erythema (mild) present. No oropharyngeal exudate.  Eyes:     Conjunctiva/sclera: Conjunctivae normal.  Cardiovascular:     Rate and Rhythm: Normal rate and regular rhythm.     Pulses: Normal pulses.  Pulmonary:     Effort: Pulmonary effort is normal. No respiratory distress.     Breath sounds: Normal breath sounds.  Musculoskeletal:     Cervical back: Normal range of motion and neck supple. No rigidity.  Skin:    General: Skin is warm and dry.     Findings: No rash.  Neurological:     Mental Status: She is alert and oriented to person, place, and time.  Psychiatric:        Mood and Affect: Mood normal.     Assessment/Plan 43 yo F with one day history of cough, congestion, body aches, exposed to Flu POC Flu test is positive for Flu A No sign of bacterial infection or other complication on exam.  Vitals wnl other than mild HTN. Patient appears to be in no acute distress.  Patient requests Tamiflu  for treatment, prescription sent.  Recommend rest, fluids, OTC medications as needed for symptoms.  May use ibuprofen  to tylenol  as needed for fever/body aches. Follow with PCP or return to clinic as needed for persistent symptoms.  Urgent Care Disposition:  Home Care    Electronically signed: Kevin Bradford Walker, PA-C 04/19/2023  6:29 PM   "

## 2024-03-19 ENCOUNTER — Encounter: Payer: Self-pay | Admitting: Obstetrics and Gynecology

## 2024-03-19 ENCOUNTER — Other Ambulatory Visit (HOSPITAL_COMMUNITY)
Admission: RE | Admit: 2024-03-19 | Discharge: 2024-03-19 | Disposition: A | Discharge status: Home / Self Care | Source: Ambulatory Visit | Attending: Obstetrics and Gynecology | Admitting: Obstetrics and Gynecology

## 2024-03-19 ENCOUNTER — Other Ambulatory Visit: Payer: Self-pay

## 2024-03-19 ENCOUNTER — Ambulatory Visit: Admitting: Obstetrics and Gynecology

## 2024-03-19 VITALS — BP 138/86 | HR 120 | Wt 218.9 lb

## 2024-03-19 DIAGNOSIS — Z1231 Encounter for screening mammogram for malignant neoplasm of breast: Secondary | ICD-10-CM | POA: Diagnosis not present

## 2024-03-19 DIAGNOSIS — Z124 Encounter for screening for malignant neoplasm of cervix: Secondary | ICD-10-CM | POA: Insufficient documentation

## 2024-03-19 DIAGNOSIS — Z113 Encounter for screening for infections with a predominantly sexual mode of transmission: Secondary | ICD-10-CM | POA: Diagnosis not present

## 2024-03-19 DIAGNOSIS — Z0000 Encounter for general adult medical examination without abnormal findings: Secondary | ICD-10-CM

## 2024-03-19 DIAGNOSIS — Z30433 Encounter for removal and reinsertion of intrauterine contraceptive device: Secondary | ICD-10-CM | POA: Diagnosis not present

## 2024-03-19 MED ORDER — LEVONORGESTREL 20.1 MCG/DAY IU IUD
1.0000 | INTRAUTERINE_SYSTEM | Freq: Once | INTRAUTERINE | Status: AC
  Administered 2024-03-19: 1 via INTRAUTERINE

## 2024-03-19 NOTE — Progress Notes (Signed)
" ° ° °  GYNECOLOGY OFFICE PROCEDURE NOTE  Tami [name] is a 44 y.o. [id number] here for Liletta  IUD removal & insertion. No GYN concerns.  Last pap smear was on 2017 and was normal. Liletta  placed 2019, rviewed she is covered until 2027 but she would prefer to go ahead and replace.   IUD Removal & Reinsertion Patient identified, informed consent performed, consent signed. Discussed risks of irregular bleeding, cramping, ovarian cysts, infection, malpositioning or misplacement of the IUD outside the uterus which may require further procedure such as laparoscopy. Reviewed that IUD is not 100% effective for contraception. Reviewed risks/benefits. An adequate timeout was performed to identify patient and procedure.  Patient was in the dorsal lithotomy position, normal external female genitalia was noted.  A speculum was placed in the patient's vagina, normal discharge was noted, no lesions. The cervix was visualized easily with the strings visible. Pap smear completed. The strings of the IUD were grasped and pulled using ring forceps. The IUD was removed in its entirety.  Patient tolerated the procedure well.    The cervix was cleaned with Betadine x 3 and grasped anteriorly with a single tooth tenaculum. The uterus was sounded to 7.5 cm.  The Liletta  IUD placed per manufacturer's recommendations.  Strings trimmed to 3 cm. Tenaculum was removed, good hemostasis noted at tenaculum site with application of pressure and silver nitrate.  Patient tolerated procedure well.    Patient was given post-procedure instructions.  Patient was also asked to check IUD strings periodically and follow up in 4 weeks for IUD check.    [name], MD, [facility] Attending Center for [facility])   "

## 2024-03-19 NOTE — Progress Notes (Signed)
 "  GYNECOLOGY OFFICE NOTE  History:  44 y.o. [id number] here today for pap semar and IUD removal and reinsertion. She is happy with IUD, has had for 6 years, had irregular bleeding with non in the last year. She would like removal and reinsertion as she has done very well.    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   Last outbreak reported at [facility] interview in 2003   Infection    Yeast inf;Not frequent lately   NSVD (normal spontaneous vaginal delivery) 07/08/2012   Trichomonas infection    Type B blood, Rh negative 12/07/2011   Unsure of LMP (last menstrual period) as reason for ultrasound scan 12/07/2011   EDC by 7week u/s    Past Surgical History:  Procedure Laterality Date   THERAPEUTIC ABORTION     WISDOM TOOTH EXTRACTION  04/2011   All 4 removed    Current Medications[1]  The following portions of the patient's history were reviewed and updated as appropriate: allergies, current medications, past family history, past medical history, past social history, past surgical history and problem list.   Review of Systems:  Pertinent items noted in HPI and remainder of comprehensive ROS otherwise negative.   Objective:  Physical Exam BP 138/86   Pulse (!) 120   Wt 218 lb 14.4 oz (99.3 kg)   BMI 40.04 kg/m  CONSTITUTIONAL: Well-developed, well-nourished female in no acute distress.  HENT:  Normocephalic, atraumatic. External right and left ear normal. Oropharynx is clear and moist EYES: Conjunctivae and EOM are normal. Pupils are equal, round, and  reactive to light. No scleral icterus.  NECK: Normal range of motion, supple, no masses SKIN: Skin is warm and dry. No rash noted. Not diaphoretic. No erythema. No pallor. NEUROLOGIC: Alert and oriented to person, place, and time. Normal reflexes, muscle tone coordination. No cranial nerve deficit noted. PSYCHIATRIC: Normal mood and affect. Normal behavior. Normal judgment and thought content. CARDIOVASCULAR: Normal heart rate noted RESPIRATORY: Effort normal, no problems with respiration noted ABDOMEN: Soft, no distention noted.   PELVIC: Normal appearing external genitalia; normal appearing vaginal mucosa and cervix.  No abnormal discharge noted.  Pap smear obtained.  pelvic cultures obtained. See note for insertion MUSCULOSKELETAL: Normal range of motion. No edema noted.  Exam done with chaperone present.  Labs and Imaging No results found.  Assessment & Plan:  1. Encounter for IUD removal and reinsertion (Primary) See note for removal and reinsertion No issues Offered pain management and she declined - Cytology - PAP  2. Pap smear for cervical cancer screening - Cytology - PAP  3. Encounter for screening mammogram for malignant neoplasm of breast - MM 3D SCREENING MAMMOGRAM BILATERAL BREAST; Future  4. Routine screening for STI (sexually transmitted infection) GC/CT on pap    Routine preventative health maintenance measures emphasized. Please refer to After Visit Summary for other counseling recommendations.   Return in about 1 month (around 04/19/2024) for string check.   [name], MD, [facility] Attending  Center for [employer] [facility])       [1]  Current Outpatient Medications:    DERMA-SMOOTHE/FS SCALP 0.01 % OIL, USE ONE APPLICATION TO DAMP SCALP PRN, Disp: , Rfl: 0   Fluocinolone Acetonide Body 0.01 % OIL, Apply topically., Disp: , Rfl:    ketoconazole (NIZORAL) 2 % shampoo, , Disp: , Rfl: 2   acetaminophen  (TYLENOL ) 500 MG tablet, Take  500 mg by mouth every 6 (six) hours as needed for mild pain. For pain  (Patient not taking: Reported on 03/19/2024), Disp: , Rfl:    ibuprofen  (ADVIL ,MOTRIN ) 600 MG tablet, TAKE 1 TABLET(600 MG) BY MOUTH EVERY 6 HOURS (Patient not taking: Reported on 03/19/2024), Disp: 385 tablet, Rfl: 1   Prenat w/o A Vit-FeFum-FePo-FA (CONCEPT OB ) 130-92.4-1 MG CAPS, Take 1 tablet by mouth daily. (Patient not taking: Reported on 03/19/2024), Disp: 30 capsule, Rfl: 12  "

## 2024-03-21 LAB — CYTOLOGY - PAP
Chlamydia: NEGATIVE
Comment: NEGATIVE
Comment: NEGATIVE
Comment: NORMAL
Diagnosis: NEGATIVE
High risk HPV: NEGATIVE
Neisseria Gonorrhea: NEGATIVE

## 2024-03-22 ENCOUNTER — Ambulatory Visit: Payer: Self-pay | Admitting: Obstetrics and Gynecology

## 2024-04-16 ENCOUNTER — Ambulatory Visit: Payer: Self-pay | Admitting: Obstetrics and Gynecology

## 2024-06-01 ENCOUNTER — Ambulatory Visit
